# Patient Record
Sex: Female | Born: 1965 | Race: White | Hispanic: No | Marital: Single | Smoking: Former smoker
Health system: Southern US, Community
[De-identification: ages and names within clinical notes are randomized; demographics above are authoritative.]

## PROBLEM LIST (undated history)

## (undated) DIAGNOSIS — M1990 Unspecified osteoarthritis, unspecified site: Secondary | ICD-10-CM

## (undated) DIAGNOSIS — A419 Sepsis, unspecified organism: Secondary | ICD-10-CM

## (undated) DIAGNOSIS — G4730 Sleep apnea, unspecified: Secondary | ICD-10-CM

## (undated) DIAGNOSIS — I2699 Other pulmonary embolism without acute cor pulmonale: Secondary | ICD-10-CM

## (undated) DIAGNOSIS — F419 Anxiety disorder, unspecified: Secondary | ICD-10-CM

## (undated) DIAGNOSIS — Z5189 Encounter for other specified aftercare: Secondary | ICD-10-CM

## (undated) DIAGNOSIS — F32A Depression, unspecified: Secondary | ICD-10-CM

## (undated) HISTORY — PX: GASTRIC BYPASS: SHX52

## (undated) HISTORY — PX: MASTECTOMY: SHX3

---

## 2016-04-28 DIAGNOSIS — C50919 Malignant neoplasm of unspecified site of unspecified female breast: Secondary | ICD-10-CM

## 2016-04-28 HISTORY — DX: Malignant neoplasm of unspecified site of unspecified female breast: C50.919

## 2020-07-17 ENCOUNTER — Emergency Department (HOSPITAL_COMMUNITY): Payer: Medicaid - Out of State

## 2020-07-17 ENCOUNTER — Inpatient Hospital Stay (HOSPITAL_COMMUNITY)
Admission: EM | Admit: 2020-07-17 | Discharge: 2020-07-21 | DRG: 871 | Disposition: A | Discharge status: Home / Self Care | Payer: Medicaid - Out of State | Attending: Internal Medicine | Admitting: Internal Medicine

## 2020-07-17 ENCOUNTER — Other Ambulatory Visit: Payer: Self-pay

## 2020-07-17 ENCOUNTER — Encounter (HOSPITAL_COMMUNITY): Payer: Self-pay

## 2020-07-17 DIAGNOSIS — J189 Pneumonia, unspecified organism: Secondary | ICD-10-CM

## 2020-07-17 DIAGNOSIS — Z9013 Acquired absence of bilateral breasts and nipples: Secondary | ICD-10-CM

## 2020-07-17 DIAGNOSIS — E876 Hypokalemia: Secondary | ICD-10-CM

## 2020-07-17 DIAGNOSIS — I5021 Acute systolic (congestive) heart failure: Secondary | ICD-10-CM | POA: Diagnosis not present

## 2020-07-17 DIAGNOSIS — Z86711 Personal history of pulmonary embolism: Secondary | ICD-10-CM

## 2020-07-17 DIAGNOSIS — A4189 Other specified sepsis: Principal | ICD-10-CM | POA: Diagnosis present

## 2020-07-17 DIAGNOSIS — J9601 Acute respiratory failure with hypoxia: Secondary | ICD-10-CM | POA: Diagnosis present

## 2020-07-17 DIAGNOSIS — A419 Sepsis, unspecified organism: Secondary | ICD-10-CM

## 2020-07-17 DIAGNOSIS — A481 Legionnaires' disease: Secondary | ICD-10-CM | POA: Diagnosis present

## 2020-07-17 DIAGNOSIS — G4730 Sleep apnea, unspecified: Secondary | ICD-10-CM | POA: Diagnosis present

## 2020-07-17 DIAGNOSIS — I248 Other forms of acute ischemic heart disease: Secondary | ICD-10-CM | POA: Diagnosis present

## 2020-07-17 DIAGNOSIS — J188 Other pneumonia, unspecified organism: Secondary | ICD-10-CM | POA: Diagnosis present

## 2020-07-17 DIAGNOSIS — F419 Anxiety disorder, unspecified: Secondary | ICD-10-CM | POA: Diagnosis present

## 2020-07-17 DIAGNOSIS — R7401 Elevation of levels of liver transaminase levels: Secondary | ICD-10-CM | POA: Diagnosis present

## 2020-07-17 DIAGNOSIS — Z20822 Contact with and (suspected) exposure to covid-19: Secondary | ICD-10-CM | POA: Diagnosis present

## 2020-07-17 DIAGNOSIS — Z91048 Other nonmedicinal substance allergy status: Secondary | ICD-10-CM | POA: Diagnosis not present

## 2020-07-17 DIAGNOSIS — R7989 Other specified abnormal findings of blood chemistry: Secondary | ICD-10-CM | POA: Diagnosis present

## 2020-07-17 DIAGNOSIS — E871 Hypo-osmolality and hyponatremia: Secondary | ICD-10-CM | POA: Diagnosis present

## 2020-07-17 DIAGNOSIS — J810 Acute pulmonary edema: Secondary | ICD-10-CM | POA: Diagnosis present

## 2020-07-17 DIAGNOSIS — Z9884 Bariatric surgery status: Secondary | ICD-10-CM

## 2020-07-17 DIAGNOSIS — R509 Fever, unspecified: Secondary | ICD-10-CM

## 2020-07-17 DIAGNOSIS — R9431 Abnormal electrocardiogram [ECG] [EKG]: Secondary | ICD-10-CM

## 2020-07-17 DIAGNOSIS — R778 Other specified abnormalities of plasma proteins: Secondary | ICD-10-CM | POA: Diagnosis not present

## 2020-07-17 HISTORY — DX: Encounter for other specified aftercare: Z51.89

## 2020-07-17 HISTORY — DX: Other pulmonary embolism without acute cor pulmonale: I26.99

## 2020-07-17 HISTORY — DX: Sleep apnea, unspecified: G47.30

## 2020-07-17 HISTORY — DX: Sepsis, unspecified organism: A41.9

## 2020-07-17 HISTORY — DX: Unspecified osteoarthritis, unspecified site: M19.90

## 2020-07-17 HISTORY — DX: Anxiety disorder, unspecified: F41.9

## 2020-07-17 HISTORY — DX: Depression, unspecified: F32.A

## 2020-07-17 LAB — CBC WITH DIFFERENTIAL/PLATELET
Abs Immature Granulocytes: 0 10*3/uL (ref 0.00–0.07)
Basophils Absolute: 0 10*3/uL (ref 0.0–0.1)
Basophils Relative: 0 %
Eosinophils Absolute: 0 10*3/uL (ref 0.0–0.5)
Eosinophils Relative: 0 %
HCT: 36.8 % (ref 36.0–46.0)
Hemoglobin: 13 g/dL (ref 12.0–15.0)
Lymphocytes Relative: 7 %
Lymphs Abs: 0.7 10*3/uL (ref 0.7–4.0)
MCH: 29.1 pg (ref 26.0–34.0)
MCHC: 35.3 g/dL (ref 30.0–36.0)
MCV: 82.5 fL (ref 80.0–100.0)
Monocytes Absolute: 0.9 10*3/uL (ref 0.1–1.0)
Monocytes Relative: 9 %
Neutro Abs: 8.4 10*3/uL — ABNORMAL HIGH (ref 1.7–7.7)
Neutrophils Relative %: 84 %
Platelets: 174 10*3/uL (ref 150–400)
RBC: 4.46 MIL/uL (ref 3.87–5.11)
RDW: 17.5 % — ABNORMAL HIGH (ref 11.5–15.5)
WBC: 10 10*3/uL (ref 4.0–10.5)
nRBC: 0 % (ref 0.0–0.2)
nRBC: 0 /100 WBC

## 2020-07-17 LAB — COMPREHENSIVE METABOLIC PANEL
ALT: 54 U/L — ABNORMAL HIGH (ref 0–44)
AST: 42 U/L — ABNORMAL HIGH (ref 15–41)
Albumin: 2.6 g/dL — ABNORMAL LOW (ref 3.5–5.0)
Alkaline Phosphatase: 56 U/L (ref 38–126)
Anion gap: 11 (ref 5–15)
BUN: 10 mg/dL (ref 6–20)
CO2: 24 mmol/L (ref 22–32)
Calcium: 7.9 mg/dL — ABNORMAL LOW (ref 8.9–10.3)
Chloride: 95 mmol/L — ABNORMAL LOW (ref 98–111)
Creatinine, Ser: 0.82 mg/dL (ref 0.44–1.00)
GFR, Estimated: >60 mL/min (ref >60)
Glucose, Bld: 103 mg/dL — ABNORMAL HIGH (ref 70–99)
Potassium: 2.5 mmol/L — CL (ref 3.5–5.1)
Sodium: 130 mmol/L — ABNORMAL LOW (ref 135–145)
Total Bilirubin: 1.1 mg/dL (ref 0.3–1.2)
Total Protein: 6.1 g/dL — ABNORMAL LOW (ref 6.5–8.1)

## 2020-07-17 LAB — I-STAT BETA HCG BLOOD, ED (MC, WL, AP ONLY): I-stat hCG, quantitative: <5 m[IU]/mL (ref <5)

## 2020-07-17 LAB — URINALYSIS, ROUTINE W REFLEX MICROSCOPIC
Bilirubin Urine: NEGATIVE
Glucose, UA: NEGATIVE mg/dL
Hgb urine dipstick: NEGATIVE
Ketones, ur: 5 mg/dL — AB
Leukocytes,Ua: NEGATIVE
Nitrite: NEGATIVE
Protein, ur: NEGATIVE mg/dL
Specific Gravity, Urine: 1.016 (ref 1.005–1.030)
pH: 7 (ref 5.0–8.0)

## 2020-07-17 LAB — PROTIME-INR
INR: 1.2 (ref 0.8–1.2)
Prothrombin Time: 14.4 seconds (ref 11.4–15.2)

## 2020-07-17 LAB — CBG MONITORING, ED: Glucose-Capillary: 110 mg/dL — ABNORMAL HIGH (ref 70–99)

## 2020-07-17 LAB — LACTIC ACID, PLASMA: Lactic Acid, Venous: 1.5 mmol/L (ref 0.5–1.9)

## 2020-07-17 LAB — BRAIN NATRIURETIC PEPTIDE: B Natriuretic Peptide: 393.9 pg/mL — ABNORMAL HIGH (ref 0.0–100.0)

## 2020-07-17 LAB — TROPONIN I (HIGH SENSITIVITY): Troponin I (High Sensitivity): 35 ng/L — ABNORMAL HIGH (ref <18)

## 2020-07-17 MED ORDER — SODIUM CHLORIDE 0.9 % IV SOLN
2.0000 g | INTRAVENOUS | Status: DC
  Administered 2020-07-17: 2 g via INTRAVENOUS
  Filled 2020-07-17: qty 20

## 2020-07-17 MED ORDER — FUROSEMIDE 10 MG/ML IJ SOLN
40.0000 mg | Freq: Once | INTRAMUSCULAR | Status: AC
  Administered 2020-07-17: 40 mg via INTRAVENOUS
  Filled 2020-07-17: qty 4

## 2020-07-17 MED ORDER — POTASSIUM CHLORIDE CRYS ER 20 MEQ PO TBCR
40.0000 meq | EXTENDED_RELEASE_TABLET | Freq: Once | ORAL | Status: AC
  Administered 2020-07-17: 40 meq via ORAL
  Filled 2020-07-17: qty 2

## 2020-07-17 MED ORDER — POTASSIUM CHLORIDE 10 MEQ/100ML IV SOLN
10.0000 meq | INTRAVENOUS | Status: AC
  Administered 2020-07-17 – 2020-07-18 (×4): 10 meq via INTRAVENOUS
  Filled 2020-07-17 (×4): qty 100

## 2020-07-17 MED ORDER — IBUPROFEN 400 MG PO TABS
600.0000 mg | ORAL_TABLET | Freq: Once | ORAL | Status: DC
  Filled 2020-07-17: qty 1

## 2020-07-17 MED ORDER — ACETAMINOPHEN 500 MG PO TABS
1000.0000 mg | ORAL_TABLET | Freq: Once | ORAL | Status: AC
  Administered 2020-07-17: 1000 mg via ORAL
  Filled 2020-07-17: qty 2

## 2020-07-17 MED ORDER — SODIUM CHLORIDE 0.9 % IV BOLUS
500.0000 mL | Freq: Once | INTRAVENOUS | Status: AC
  Administered 2020-07-17: 500 mL via INTRAVENOUS

## 2020-07-17 MED ORDER — IOHEXOL 350 MG/ML SOLN
80.0000 mL | Freq: Once | INTRAVENOUS | Status: AC | PRN
  Administered 2020-07-17: 80 mL via INTRAVENOUS

## 2020-07-17 MED ORDER — FENTANYL CITRATE (PF) 100 MCG/2ML IJ SOLN
50.0000 ug | Freq: Once | INTRAMUSCULAR | Status: AC
  Administered 2020-07-18: 50 ug via INTRAVENOUS
  Filled 2020-07-17: qty 2

## 2020-07-17 NOTE — ED Triage Notes (Signed)
Pt arrives to ED BIB [facility] due to possible sepsis. Per EMS pt is from out of town and was prescribe PO antibiotics for a UTI but has been off the antibiotics since she has moved here. Is tachycardic, SHOB and weak upon arrival.  T 102.6 Oral BP 158/92 HR 104 R 28 O2 94% 2L [place] CBG 114

## 2020-07-17 NOTE — ED Provider Notes (Signed)
Emergency Department Provider Note   I have reviewed the triage vital signs and the nursing notes.   HISTORY  Chief Complaint Code Sepsis   HPI Sandra Farmer is a 55 y.o. female with past medical history reviewed below but not complete due to patient with no records in this area presents to the emergency department with fever, urine frequency with dysuria, mild back discomfort.  Patient is moving here from [facility].  She drove across the country with a [employer] and got in the last night to be with her daughter.  She states that she stopped in [place] on Tuesday because she was having some dysuria along with frequency and some pain in the back which is more severe than today.  She states she was diagnosed with a urine infection and given IV antibiotics.  Antibiotics were called into the pharmacy but patient has not started them due to being on the road.  She continues to feel badly and has had body aches.  Her back pain is improved but continues to have urine symptoms.  She is also noticed some shortness of breath.  She notes that 8 weeks ago she had a gastric bypass surgery.  She states that went on without complication and she has had a normal recovery and is overall feeling improved.  Denies any abdominal pain, chest pain, or headache. She has been vaccinated and boosted against COVID 19.    Patient arrived by EMS.  They found her to be febrile to 102 F.  She had tachycardia and a found her to be mildly hypoxemic and started her on 2 L nasal cannula.  She was given Zofran for nausea prior to arrival.  No fever medication or antibiotics PTA.   Past Medical History:  Diagnosis Date  . Arthritis   . Blood transfusion without reported diagnosis   . Pulmonary emboli ([place])   . Sepsis ([place])   . Sleep apnea     There are no problems to display for this patient.   Past Surgical History:  Procedure Laterality Date  . GASTRIC BYPASS    . MASTECTOMY Bilateral      Allergies Patient has no known allergies.  History reviewed. No pertinent family history.  Social History Social History   Tobacco Use  . Smoking status: Never Smoker  . Smokeless tobacco: Never Used  Substance Use Topics  . Alcohol use: Never  . Drug use: Never    Review of Systems  Constitutional: Positive fever/chills Eyes: No visual changes. ENT: No sore throat. Cardiovascular: Denies chest pain. Respiratory: Denies shortness of breath. Gastrointestinal: No abdominal pain. Mild nausea, no vomiting.  No diarrhea.  No constipation. Genitourinary: Positive for dysuria and frequency.  Musculoskeletal: Positive for back pain. Skin: Negative for rash. Neurological: Negative for headaches, focal weakness or numbness.  10-point ROS otherwise negative.  ____________________________________________   PHYSICAL EXAM:  VITAL SIGNS: ED Triage Vitals [07/17/20 1855]  Enc Vitals Group     BP 110/66     Pulse Rate (!) 104     Resp (!) 30     Temp 99.1 F (37.3 C)     Temp Source Oral     SpO2 92 %     Weight 200 lb (90.7 kg)     Height 5\' 4"  (1.626 m)   Constitutional: Alert and oriented. Well appearing and in no acute distress. Eyes: Conjunctivae are normal. Head: Atraumatic. Nose: No congestion/rhinnorhea. Mouth/Throat: Mucous membranes are moist.  Neck: No stridor.  Cardiovascular: Mild tachycardia. Good peripheral circulation. Grossly normal heart sounds.   Respiratory: Mild increased respiratory effort.  No retractions. Lungs w/ crackles at the bases.  Gastrointestinal: Soft and nontender. No distention. Well appearing abdominal incisions. No CVA tenderness.  Musculoskeletal: No lower extremity tenderness nor edema. No gross deformities of extremities. Neurologic:  Normal speech and language. No gross focal neurologic deficits are appreciated.  Skin:  Skin is warm, dry and intact. No rash noted.   ____________________________________________   LABS (all  labs ordered are listed, but only abnormal results are displayed)  Labs Reviewed  COMPREHENSIVE METABOLIC PANEL - Abnormal; Notable for the following components:      Result Value   Sodium 130 (*)    Potassium 2.5 (*)    Chloride 95 (*)    Glucose, Bld 103 (*)    Calcium 7.9 (*)    Total Protein 6.1 (*)    Albumin 2.6 (*)    AST 42 (*)    ALT 54 (*)    All other components within normal limits  CBC WITH DIFFERENTIAL/PLATELET - Abnormal; Notable for the following components:   RDW 17.5 (*)    Neutro Abs 8.4 (*)    All other components within normal limits  BRAIN NATRIURETIC PEPTIDE - Abnormal; Notable for the following components:   B Natriuretic Peptide 393.9 (*)    All other components within normal limits  CBG MONITORING, ED - Abnormal; Notable for the following components:   Glucose-Capillary 110 (*)    All other components within normal limits  TROPONIN I (HIGH SENSITIVITY) - Abnormal; Notable for the following components:   Troponin I (High Sensitivity) 35 (*)    All other components within normal limits  CULTURE, BLOOD (ROUTINE X 2)  CULTURE, BLOOD (ROUTINE X 2)  URINE CULTURE  RESP PANEL BY RT-PCR (FLU A&B, COVID) ARPGX2  LACTIC ACID, PLASMA  PROTIME-INR  LACTIC ACID, PLASMA  URINALYSIS, ROUTINE W REFLEX MICROSCOPIC  MAGNESIUM  I-STAT BETA HCG BLOOD, ED ([facility], [facility], [facility] ONLY)  TROPONIN I (HIGH SENSITIVITY)   ____________________________________________  EKG   EKG Interpretation  Date/Time:  Tuesday July 17 2020 18:54:00 EDT Ventricular Rate:  101 PR Interval:    QRS Duration: 101 QT Interval:  396 QTC Calculation: 514 R Axis:   -22 Text Interpretation: Sinus tachycardia Probable left atrial enlargement Borderline left axis deviation Anterior infarct, old Prolonged QT interval Baseline wander in lead(s) V2 V4 V5 V6 No old tracings for comparison Confirmed by Nanda Quinton [phone]) on 07/17/2020 6:56:08 PM        ____________________________________________  RADIOLOGY  DG Chest 2 View  Result Date: 07/17/2020 CLINICAL DATA:  Shortness of breath. EXAM: CHEST - 2 VIEW COMPARISON:  None. FINDINGS: The heart size and mediastinal contours are within normal limits. No pneumothorax or pleural effusion is noted. Mild interstitial densities are noted in both lung bases concerning for possible pulmonary edema. The visualized skeletal structures are unremarkable. IMPRESSION: Mild bibasilar interstitial densities concerning for possible pulmonary edema. Electronically Signed   By: Marijo Conception M.D.   On: 07/17/2020 20:30   CT Angio Chest PE W and/or Wo Contrast  Result Date: 07/17/2020 CLINICAL DATA:  Shortness of breath EXAM: CT ANGIOGRAPHY CHEST WITH CONTRAST TECHNIQUE: Multidetector CT imaging of the chest was performed using the standard protocol during bolus administration of intravenous contrast. Multiplanar CT image reconstructions and MIPs were obtained to evaluate the vascular anatomy. CONTRAST:  57mL OMNIPAQUE IOHEXOL 350 MG/ML SOLN COMPARISON:  None. FINDINGS: Cardiovascular:  There is a optimal opacification of the pulmonary arteries. There is no central,segmental, or subsegmental filling defects within the pulmonary arteries. The heart is normal in size. No pericardial effusion or thickening. No evidence right heart strain. There is normal three-vessel brachiocephalic anatomy without proximal stenosis. The thoracic aorta is normal in appearance. Mediastinum/Nodes: No hilar, mediastinal, or axillary adenopathy. Thyroid gland, trachea, and esophagus demonstrate no significant findings. Lungs/Pleura: Multifocal patchy airspace opacities are seen throughout both lungs predominantly at both lung bases. No pleural effusion or pneumothorax is seen. Upper Abdomen: No acute abnormalities present in the visualized portions of the upper abdomen. There is diffuse low density seen throughout the liver parenchyma  Musculoskeletal: No chest wall abnormality. No acute or significant osseous findings. Review of the MIP images confirms the above findings. IMPRESSION: No central, segmental, or subsegmental pulmonary embolism Multifocal patchy airspace opacities throughout both lungs, likely consistent with multifocal pneumonia Electronically Signed   By: Prudencio Pair M.D.   On: 07/17/2020 22:38    ____________________________________________   PROCEDURES  Procedure(s) performed:   .Critical Care Performed by: Margette Fast, MD Authorized by: Margette Fast, MD   Critical care provider statement:    Critical care time (minutes):  45   Critical care time was exclusive of:  Separately billable procedures and treating other patients and teaching time   Critical care was necessary to treat or prevent imminent or life-threatening deterioration of the following conditions:  Respiratory failure   Critical care was time spent personally by me on the following activities:  Discussions with consultants, evaluation of patient's response to treatment, examination of patient, ordering and performing treatments and interventions, ordering and review of laboratory studies, ordering and review of radiographic studies, pulse oximetry, re-evaluation of patient's condition, obtaining history from patient or surrogate, review of old charts, blood draw for specimens and development of treatment plan with patient or surrogate   I assumed direction of critical care for this patient from another provider in my specialty: no     Care discussed with: admitting provider      ____________________________________________   INITIAL IMPRESSION / [place] / ED COURSE  Pertinent labs & imaging results that were available during my care of the patient were reviewed by me and considered in my medical decision making (see chart for details).   Patient presents the emergency department with febrile illness concerning for  developing sepsis.  She is tachycardic here and is mildly dyspneic with new oxygen requirement.  Lung sounds are equal.  She is not having chest pain after the cross-country drive. PE is a consideration. She was diagnosed with a urinary tract infection in [place] and received a single dose of an unknown IV antibiotic but did not start the oral antibiotics prescribed.  She has no CVA tenderness and her back pain is improving.  Pyelonephritis or other obstructive etiology such as kidney stone is lower on my differential.  Plan for sepsis labs, chest x-ray, reassess.   Labs are consistent with hypokalemia and mild low calcium.  LFTs are slightly elevated along with a very mildly elevated troponin.  BNP is around 400 with no baselines on any of these labs.  Lactate is normal and patient does not have a leukocytosis.  Covid and flu PCR is pending. Patient comfortable on supplemental O2. No signs of impending respiratory compromise. Patient now complaining of HA which has started since arrival in the ED but not before. Ordered abx and will follow with CTA given elevated  PE risk and patient now describing some pain with breathing in. Will not CT the abdomen. No complications since her surgery two months ago and abdomen is diffusely soft and non-tender.   10:44 PM  CTA of the chest shows no central PE.  Findings consistent with likely multifocal pneumonia.  COVID is pending but patient is vaccinated and boosted.  BNP is elevated and I have ordered Lasix along with antibiotics.   Discussed patient's case with [facility] to request admission. Patient and family (if present) updated with plan. Care transferred to [facility] service.  I reviewed all nursing notes, vitals, pertinent old records, EKGs, labs, imaging (as available).  ____________________________________________  FINAL CLINICAL IMPRESSION(S) / ED DIAGNOSES  Final diagnoses:  Febrile illness  Prolonged Q-T interval on ECG  Acute pulmonary edema (HCC)  Multifocal  pneumonia     MEDICATIONS GIVEN DURING THIS VISIT:  Medications  potassium chloride SA (KLOR-CON) CR tablet 40 mEq (has no administration in time range)  potassium chloride 10 mEq in 100 mL IVPB (has no administration in time range)  furosemide (LASIX) injection 40 mg (has no administration in time range)  cefTRIAXone (ROCEPHIN) 2 g in sodium chloride 0.9 % 100 mL IVPB (has no administration in time range)  ibuprofen (ADVIL) tablet 600 mg (has no administration in time range)  sodium chloride 0.9 % bolus 500 mL (500 mLs Intravenous New Bag/Given 07/17/20 1944)  acetaminophen (TYLENOL) tablet 1,000 mg (1,000 mg Oral Given 07/17/20 1941)  iohexol (OMNIPAQUE) 350 MG/ML injection 80 mL (80 mLs Intravenous Contrast Given 07/17/20 2226)    Note:  This document was prepared using Dragon voice recognition software and may include unintentional dictation errors.  Nanda Quinton, MD, [facility] Emergency Medicine    Marrietta Thunder, [facility], MD 07/18/20 [phone]

## 2020-07-18 ENCOUNTER — Encounter (HOSPITAL_COMMUNITY): Payer: Self-pay | Admitting: Internal Medicine

## 2020-07-18 ENCOUNTER — Inpatient Hospital Stay (HOSPITAL_COMMUNITY): Payer: Medicaid - Out of State

## 2020-07-18 ENCOUNTER — Other Ambulatory Visit: Payer: Self-pay

## 2020-07-18 DIAGNOSIS — E876 Hypokalemia: Secondary | ICD-10-CM

## 2020-07-18 DIAGNOSIS — J189 Pneumonia, unspecified organism: Secondary | ICD-10-CM

## 2020-07-18 DIAGNOSIS — I5021 Acute systolic (congestive) heart failure: Secondary | ICD-10-CM

## 2020-07-18 DIAGNOSIS — R7989 Other specified abnormal findings of blood chemistry: Secondary | ICD-10-CM

## 2020-07-18 DIAGNOSIS — R778 Other specified abnormalities of plasma proteins: Secondary | ICD-10-CM

## 2020-07-18 DIAGNOSIS — A419 Sepsis, unspecified organism: Secondary | ICD-10-CM

## 2020-07-18 LAB — RESP PANEL BY RT-PCR (FLU A&B, COVID) ARPGX2
Influenza A by PCR: NEGATIVE
Influenza B by PCR: NEGATIVE
SARS Coronavirus 2 by RT PCR: NEGATIVE

## 2020-07-18 LAB — ECHOCARDIOGRAM COMPLETE
Area-P 1/2: 3.6 cm2
Height: 64 in
S' Lateral: 2.8 cm
Single Plane A4C EF: 63.9 %
Weight: 3200 oz

## 2020-07-18 LAB — BASIC METABOLIC PANEL
Anion gap: 11 (ref 5–15)
BUN: 9 mg/dL (ref 6–20)
CO2: 22 mmol/L (ref 22–32)
Calcium: 7.3 mg/dL — ABNORMAL LOW (ref 8.9–10.3)
Chloride: 96 mmol/L — ABNORMAL LOW (ref 98–111)
Creatinine, Ser: 0.76 mg/dL (ref 0.44–1.00)
GFR, Estimated: >60 mL/min (ref >60)
Glucose, Bld: 87 mg/dL (ref 70–99)
Potassium: 3.9 mmol/L (ref 3.5–5.1)
Sodium: 129 mmol/L — ABNORMAL LOW (ref 135–145)

## 2020-07-18 LAB — CBC
HCT: 36.5 % (ref 36.0–46.0)
Hemoglobin: 12.5 g/dL (ref 12.0–15.0)
MCH: 28.7 pg (ref 26.0–34.0)
MCHC: 34.2 g/dL (ref 30.0–36.0)
MCV: 83.9 fL (ref 80.0–100.0)
Platelets: 203 10*3/uL (ref 150–400)
RBC: 4.35 MIL/uL (ref 3.87–5.11)
RDW: 17.9 % — ABNORMAL HIGH (ref 11.5–15.5)
WBC: 10 10*3/uL (ref 4.0–10.5)
nRBC: 0 % (ref 0.0–0.2)

## 2020-07-18 LAB — PROCALCITONIN: Procalcitonin: 8.69 ng/mL

## 2020-07-18 LAB — LACTIC ACID, PLASMA: Lactic Acid, Venous: 1.6 mmol/L (ref 0.5–1.9)

## 2020-07-18 LAB — TROPONIN I (HIGH SENSITIVITY): Troponin I (High Sensitivity): 24 ng/L — ABNORMAL HIGH (ref <18)

## 2020-07-18 LAB — MRSA PCR SCREENING: MRSA by PCR: NEGATIVE

## 2020-07-18 LAB — MAGNESIUM: Magnesium: 2.1 mg/dL (ref 1.7–2.4)

## 2020-07-18 LAB — HIV ANTIBODY (ROUTINE TESTING W REFLEX): HIV Screen 4th Generation wRfx: NONREACTIVE

## 2020-07-18 MED ORDER — VANCOMYCIN HCL 2000 MG/400ML IV SOLN
2000.0000 mg | Freq: Once | INTRAVENOUS | Status: AC
  Administered 2020-07-18: 2000 mg via INTRAVENOUS
  Filled 2020-07-18: qty 400

## 2020-07-18 MED ORDER — ENOXAPARIN SODIUM 40 MG/0.4ML ~~LOC~~ SOLN
40.0000 mg | SUBCUTANEOUS | Status: DC
  Administered 2020-07-18 – 2020-07-20 (×3): 40 mg via SUBCUTANEOUS
  Filled 2020-07-18 (×4): qty 0.4

## 2020-07-18 MED ORDER — PANTOPRAZOLE SODIUM 40 MG PO TBEC
40.0000 mg | DELAYED_RELEASE_TABLET | Freq: Every day | ORAL | Status: DC
  Administered 2020-07-18 – 2020-07-21 (×4): 40 mg via ORAL
  Filled 2020-07-18 (×4): qty 1

## 2020-07-18 MED ORDER — ACETAMINOPHEN 325 MG PO TABS
650.0000 mg | ORAL_TABLET | Freq: Four times a day (QID) | ORAL | Status: DC | PRN
  Administered 2020-07-18 – 2020-07-20 (×8): 650 mg via ORAL
  Filled 2020-07-18 (×8): qty 2

## 2020-07-18 MED ORDER — SODIUM CHLORIDE 0.9 % IV SOLN
2.0000 g | Freq: Three times a day (TID) | INTRAVENOUS | Status: DC
  Administered 2020-07-18 – 2020-07-21 (×10): 2 g via INTRAVENOUS
  Filled 2020-07-18 (×12): qty 2

## 2020-07-18 MED ORDER — POTASSIUM CHLORIDE CRYS ER 20 MEQ PO TBCR
40.0000 meq | EXTENDED_RELEASE_TABLET | Freq: Once | ORAL | Status: AC
  Administered 2020-07-18: 40 meq via ORAL
  Filled 2020-07-18: qty 2

## 2020-07-18 MED ORDER — IBUPROFEN 400 MG PO TABS
400.0000 mg | ORAL_TABLET | Freq: Once | ORAL | Status: AC
  Administered 2020-07-18: 400 mg via ORAL
  Filled 2020-07-18: qty 1

## 2020-07-18 MED ORDER — VANCOMYCIN HCL 1250 MG/250ML IV SOLN
1250.0000 mg | INTRAVENOUS | Status: DC
  Administered 2020-07-19 – 2020-07-20 (×2): 1250 mg via INTRAVENOUS
  Filled 2020-07-18 (×2): qty 250

## 2020-07-18 MED ORDER — PERFLUTREN LIPID MICROSPHERE
1.0000 mL | INTRAVENOUS | Status: AC | PRN
  Administered 2020-07-18: 2 mL via INTRAVENOUS
  Filled 2020-07-18: qty 10

## 2020-07-18 NOTE — ED Notes (Signed)
Dr. Rathore at bedside 

## 2020-07-18 NOTE — ED Notes (Signed)
Report to 2W RN at this time

## 2020-07-18 NOTE — ED Notes (Signed)
Report at this time from previous shift RN

## 2020-07-18 NOTE — ED Notes (Signed)
Diet ordered for pt

## 2020-07-18 NOTE — ED Notes (Signed)
Patient c/o headache, being unable to sleep. Patient tachypneic in high 30s. Temp 103.2. Dr. Marlowe Sax paged.

## 2020-07-18 NOTE — Progress Notes (Signed)
Pharmacy Antibiotic Note  Sandra Farmer is a 55 y.o. female admitted on 07/17/2020 with pneumonia.  Pharmacy has been consulted for vancomycin and cefepime dosing.  Plan: Vancomycin 2000mg  x1 then 1250mg  IV Q24H. Goal AUC 400-550.  Expected AUC 460.  SCr used 0.83.  Cefepime 2g IV Q8H.  Height: 5\' 4"  (162.6 cm) Weight: 90.7 kg (200 lb) IBW/kg (Calculated) : 54.7  Temp (24hrs), Avg:100.5 F (38.1 C), Min:99.1 F (37.3 C), Max:103.2 F (39.6 C)  Recent Labs  Lab 07/17/20 1858 07/17/20 2309  WBC 10.0  --   CREATININE 0.82  --   LATICACIDVEN 1.5 1.6    Estimated Creatinine Clearance: 83.6 mL/min (by C-G formula based on SCr of 0.82 mg/dL).    Allergies  Allergen Reactions  . Other Rash    Surgical tape     Thank you for allowing pharmacy to be a part of this patient's care.  Wynona Neat, PharmD, BCPS  07/18/2020 5:25 AM

## 2020-07-18 NOTE — ED Notes (Signed)
Spoke with Dr. Marlowe Sax, she will place orders for Tylenol and come evaluate patient.

## 2020-07-18 NOTE — Progress Notes (Signed)
PROGRESS NOTE                                                                             PROGRESS NOTE                                                                                                                                                                                                             Patient Demographics:    Sandra Farmer, is a 55 y.o. female, DOB - Jun 21, 1965, XLK:[id number]  Outpatient Primary MD for the patient is Patient, No Pcp Per    LOS - 1  Admit date - 07/17/2020    Chief Complaint  Patient presents with  . Code Sepsis       Brief Narrative    This is a no charge note as patient seen and admitted earlier today, chart, imaging and labs were reviewed, patient was seen and examined.   HPI: Sandra Farmer is a 55 y.o. female with medical history significant of PE, arthritis, sleep apnea, gastric bypass surgery, mastectomy presented to the ED with complaints of fever and urinary symptoms.  Febrile with EMS with temperature 102.6 F and slightly tachycardic.  Placed on 2 L supplemental oxygen.  Patient reports 1 week history of dyspnea, cough, and high fevers.  States she is moving here from [place].  Last week she went to an emergency room in [place] as she was vomiting and having pain in her back and they told her she had a urinary tract infection.  She was prescribed an antibiotic but has not been able to pick it up from the pharmacy as she is traveling.  States back pain and vomiting have now resolved.  States she had gastric bypass surgery done 8 weeks ago and recovered quite well.  She is fully vaccinated against COVID including booster shot.  No additional history could be obtained from her at this time.  ED Course: Febrile with temperature 103.2 F.  Tachycardic and tachypneic.  Not hypotensive.  Satting well on 2 L supplemental  oxygen.  Labs showing WBC 10.0, hemoglobin 13.0, platelet count 174K.  Sodium  130, potassium 2.5, chloride 95, bicarb 24, BUN 10, creatinine 0.8, glucose 103.  AST 42, ALT 54.  Alk phos and T bili normal.  Lactic acid normal x2.  INR 1.2.  UA without signs of infection.  Urine culture pending.  High-sensitivity troponin 35 >24.  EKG not suggestive of ACS.  BNP 393.  Blood culture x2 pending.  Magnesium 2.1.  SARS-CoV-2 PCR test negative.  Influenza panel negative.    Chest x-ray showing mild bibasilar interstitial densities concerning for possible pulmonary edema.  CT angiogram chest negative for PE.  Showing multifocal patchy airspace opacities throughout both lungs, likely consistent with multifocal pneumonia.  Patient was given Tylenol, fentanyl, IV Lasix 40 mg, oral and IV potassium, ceftriaxone, and a 500 cc fluid bolus.   Subjective:    Sandra Farmer today is weakness, reports cough, fever and chills.    Assessment  & Plan :    Principal Problem:   Multifocal pneumonia Active Problems:   Sepsis ([place])   Hypokalemia   Elevated troponin   Elevated brain natriuretic peptide (BNP) level   Sepsis and acute hypoxemic respiratory failure secondary to multifocal pneumonia: Febrile and tachycardic.  She is desatting to the upper 80s on room air but maintaining sats in the mid 90s on 2 L supplemental oxygen. Significantly tachypneic with respiratory rate in the upper 30s.  No significant leukocytosis on labs.  No lactic acidosis or hypotension.  Tested negative for Covid and flu.  CT angiogram negative for PE but showing findings consistent with multifocal pneumonia. -Vancomycin and cefepime.  Avoiding 30 cc/Kg fluid boluses per sepsis protocol due to elevated BNP.  Blood culture x2 pending.  Check procalcitonin level and monitor WBC count.  Continue supplemental oxygen.    Hypokalemia with EKG changes: Potassium 2.5 and EKG showing mild QT prolongation.  Magnesium 2.1.  Suspect hypokalemia is due to poor oral intake in the setting of recent gastric bypass surgery  and also patient reported some episodes of vomiting last week. -Potassium supplementation and continue to monitor very closely.  Keep potassium above 4 and magnesium above 2.  Avoid QT prolonging drugs if possible.  Repeat EKG in the morning.  Mild troponin elevation: Troponin mildly elevated but stable.  EKG not suggestive of ACS.  Patient is not endorsing chest pain.  Suspect troponin elevation is due to demand ischemia from multifocal pneumonia/sepsis.  Mild transaminitis: Likely due to sepsis.  Alk phos and T bili normal.  Not vomiting at present.  Abdominal exam benign. -Continue to monitor  Elevated BNP: No documented history of CHF or prior echo results in the chart.  Chest x-ray was concerning for possible pulmonary edema and patient received IV Lasix 40 mg in the ED.  However, CT showing findings consistent with multifocal pneumonia. -Echocardiogram ordered, monitor volume status very closely  Recent gastric bypass  SpO2: 96 % O2 Flow Rate (L/min): 2 L/min  Recent Labs  Lab 07/17/20 1858 07/17/20 1914 07/17/20 2309 07/18/20 0021 07/18/20 0515  WBC 10.0  --   --   --  10.0  PLT 174  --   --   --  203  BNP  --  393.9*  --   --   --   AST 42*  --   --   --   --   ALT 54*  --   --   --   --   ALKPHOS 56  --   --   --   --  BILITOT 1.1  --   --   --   --   ALBUMIN 2.6*  --   --   --   --   INR 1.2  --   --   --   --   LATICACIDVEN 1.5  --  1.6  --   --   SARSCOV2NAA  --   --   --  NEGATIVE  --        ABG  No results found for: PHART, PCO2ART, PO2ART, HCO3, TCO2, ACIDBASEDEF, O2SAT    Condition - Extremely Guarded  Family Communication  :  None at bedside  Code Status :  Full  Consults  :  none  Disposition Plan  :    Status is: Inpatient  Remains inpatient appropriate because:IV treatments appropriate due to intensity of illness or inability to take PO   Dispo: The patient is from: Home              Anticipated d/c is to: Home              Patient  currently is not medically stable to d/c.   Difficult to place patient No      DVT Prophylaxis  :  Lovenox Lab Results  Component Value Date   PLT 203 07/18/2020    Diet :  Diet Order            Diet Heart Room service appropriate? Yes; Fluid consistency: Thin  Diet effective now                  Inpatient Medications  Scheduled Meds: . enoxaparin (LOVENOX) injection  40 mg Subcutaneous Q24H   Continuous Infusions: . ceFEPime (MAXIPIME) IV Stopped (07/18/20 1432)  . [START ON 07/19/2020] vancomycin     PRN Meds:.acetaminophen  Antibiotics  :    Anti-infectives (From admission, onward)   Start     Dose/Rate Route Frequency Ordered Stop   07/19/20 0600  vancomycin (VANCOREADY) IVPB 1250 mg/250 mL        1,250 mg 166.7 mL/hr over 90 Minutes Intravenous Every 24 hours 07/18/20 0529     07/18/20 0800  ceFEPIme (MAXIPIME) 2 g in sodium chloride 0.9 % 100 mL IVPB        2 g 200 mL/hr over 30 Minutes Intravenous Every 8 hours 07/18/20 0523     07/18/20 0530  vancomycin (VANCOREADY) IVPB 2000 mg/400 mL        2,000 mg 200 mL/hr over 120 Minutes Intravenous  Once 07/18/20 0523 07/18/20 0813   07/17/20 2200  cefTRIAXone (ROCEPHIN) 2 g in sodium chloride 0.9 % 100 mL IVPB  Status:  Discontinued        2 g 200 mL/hr over 30 Minutes Intravenous Every 24 hours 07/17/20 2155 07/18/20 0517       Sandra Farmer M.D on 07/18/2020 at 3:09 PM  To page go to [email or web address]   [facility] -  Office  [phone]      Objective:   Vitals:   07/18/20 1100 07/18/20 1200 07/18/20 1215 07/18/20 1343  BP: 131/75  (!) 137/91   Pulse: (!) 111 (!) 107 (!) 117   Resp:  (!) 24 (!) 24   Temp:    (!) 100.7 F (38.2 C)  TempSrc:    Oral  SpO2: 94% 94% 96%   Weight:      Height:        Wt Readings from Last 3 Encounters:  07/17/20 90.7 kg  Intake/Output Summary (Last 24 hours) at 07/18/2020 1509 Last data filed at 07/18/2020 1225 Gross per 24 hour  Intake -   Output 3550 ml  Net -3550 ml     Physical Exam  Awake Alert, No new F.N deficits, Normal affect Symmetrical Chest wall movement, Good air movement bilaterally, scattered rales RRR,No Gallops,Rubs or new Murmurs, No Parasternal Heave +ve B.Sounds, Abd Soft, No tenderness, No organomegaly appriciated, No rebound - guarding or rigidity. No Cyanosis, Clubbing or edema, No new Rash or bruise      Data Review:    CBC Recent Labs  Lab 07/17/20 1858 07/18/20 0515  WBC 10.0 10.0  HGB 13.0 12.5  HCT 36.8 36.5  PLT 174 203  MCV 82.5 83.9  MCH 29.1 28.7  MCHC 35.3 34.2  RDW 17.5* 17.9*  LYMPHSABS 0.7  --   MONOABS 0.9  --   EOSABS 0.0  --   BASOSABS 0.0  --     Recent Labs  Lab 07/17/20 1858 07/17/20 1914 07/17/20 2309 07/17/20 2310 07/18/20 0515  NA 130*  --   --   --  129*  K 2.5*  --   --   --  3.9  CL 95*  --   --   --  96*  CO2 24  --   --   --  22  GLUCOSE 103*  --   --   --  87  BUN 10  --   --   --  9  CREATININE 0.82  --   --   --  0.76  CALCIUM 7.9*  --   --   --  7.3*  AST 42*  --   --   --   --   ALT 54*  --   --   --   --   ALKPHOS 56  --   --   --   --   BILITOT 1.1  --   --   --   --   ALBUMIN 2.6*  --   --   --   --   MG  --   --   --  2.1  --   LATICACIDVEN 1.5  --  1.6  --   --   INR 1.2  --   --   --   --   BNP  --  393.9*  --   --   --     ------------------------------------------------------------------------------------------------------------------ No results for input(s): CHOL, HDL, LDLCALC, TRIG, CHOLHDL, LDLDIRECT in the last 72 hours.  No results found for: HGBA1C ------------------------------------------------------------------------------------------------------------------ No results for input(s): TSH, T4TOTAL, T3FREE, THYROIDAB in the last 72 hours.  Invalid input(s): FREET3  Cardiac Enzymes No results for input(s): CKMB, TROPONINI, MYOGLOBIN in the last 168 hours.  Invalid input(s):  CK ------------------------------------------------------------------------------------------------------------------    Component Value Date/Time   BNP 393.9 (H) 07/17/2020 1914    Micro Results Recent Results (from the past 240 hour(s))  Culture, blood (Routine x 2)     Status: None (Preliminary result)   Collection Time: 07/17/20  7:30 PM   Specimen: BLOOD  Result Value Ref Range Status   Specimen Description BLOOD RIGHT ANTECUBITAL  Final   Special Requests   Final    BOTTLES DRAWN AEROBIC AND ANAEROBIC Blood Culture results may not be optimal due to an inadequate volume of blood received in culture bottles   Culture   Final    NO GROWTH < 12 HOURS Performed at [facility],  [address], [place], [place] [address]    Report Status PENDING  Incomplete  Culture, blood (Routine x 2)     Status: None (Preliminary result)   Collection Time: 07/17/20  7:35 PM   Specimen: BLOOD LEFT FOREARM  Result Value Ref Range Status   Specimen Description BLOOD LEFT FOREARM  Final   Special Requests   Final    BOTTLES DRAWN AEROBIC AND ANAEROBIC Blood Culture results may not be optimal due to an inadequate volume of blood received in culture bottles   Culture   Final    NO GROWTH < 12 HOURS Performed at [facility], [place] [address], [place], [place] [address]    Report Status PENDING  Incomplete  Resp Panel by RT-PCR (Flu A&B, Covid) Nasopharyngeal Swab     Status: None   Collection Time: 07/18/20 12:21 AM   Specimen: Nasopharyngeal Swab; Nasopharyngeal(NP) swabs in vial transport medium  Result Value Ref Range Status   SARS Coronavirus 2 by RT PCR NEGATIVE NEGATIVE Final    Comment: (NOTE) SARS-CoV-2 target nucleic acids are NOT DETECTED.  The SARS-CoV-2 RNA is generally detectable in upper respiratory specimens during the acute phase of infection. The lowest concentration of SARS-CoV-2 viral copies this assay can detect is 138 copies/mL. A negative result does not  preclude SARS-Cov-2 infection and should not be used as the sole basis for treatment or other patient management decisions. A negative result may occur with  improper specimen collection/handling, submission of specimen other than nasopharyngeal swab, presence of viral mutation(s) within the areas targeted by this assay, and inadequate number of viral copies(<138 copies/mL). A negative result must be combined with clinical observations, patient history, and epidemiological information. The expected result is Negative.  Fact Sheet for Patients:  EntrepreneurPulse.com.au  Fact Sheet for Healthcare Providers:  IncredibleEmployment.be  This test is no t yet approved or cleared by the [place] FDA and  has been authorized for detection and/or diagnosis of SARS-CoV-2 by FDA under an Emergency Use Authorization (EUA). This EUA will remain  in effect (meaning this test can be used) for the duration of the COVID-19 declaration under Section 564(b)(1) of the Act, 21 U.S.C.section 360bbb-3(b)(1), unless the authorization is terminated  or revoked sooner.       Influenza A by PCR NEGATIVE NEGATIVE Final   Influenza B by PCR NEGATIVE NEGATIVE Final    Comment: (NOTE) The Xpert Xpress SARS-CoV-2/FLU/RSV plus assay is intended as an aid in the diagnosis of influenza from Nasopharyngeal swab specimens and should not be used as a sole basis for treatment. Nasal washings and aspirates are unacceptable for Xpert Xpress SARS-CoV-2/FLU/RSV testing.  Fact Sheet for Patients: EntrepreneurPulse.com.au  Fact Sheet for Healthcare Providers: IncredibleEmployment.be  This test is not yet approved or cleared by the [place] FDA and has been authorized for detection and/or diagnosis of SARS-CoV-2 by FDA under an Emergency Use Authorization (EUA). This EUA will remain in effect (meaning this test can be used) for the  duration of the COVID-19 declaration under Section 564(b)(1) of the Act, 21 U.S.C. section 360bbb-3(b)(1), unless the authorization is terminated or revoked.  Performed at [facility], [place] [address], [place], [place] [address]   MRSA PCR Screening     Status: None   Collection Time: 07/18/20  8:47 AM   Specimen: Nasopharyngeal  Result Value Ref Range Status   MRSA by PCR NEGATIVE NEGATIVE Final    Comment:        The  GeneXpert MRSA Assay (FDA approved for NASAL specimens only), is one component of a comprehensive MRSA colonization surveillance program. It is not intended to diagnose MRSA infection nor to guide or monitor treatment for MRSA infections. Performed at [facility], [place] [address] [place], [place] [address]     Radiology Reports DG Chest 2 View  Result Date: 07/17/2020 CLINICAL DATA:  Shortness of breath. EXAM: CHEST - 2 VIEW COMPARISON:  None. FINDINGS: The heart size and mediastinal contours are within normal limits. No pneumothorax or pleural effusion is noted. Mild interstitial densities are noted in both lung bases concerning for possible pulmonary edema. The visualized skeletal structures are unremarkable. IMPRESSION: Mild bibasilar interstitial densities concerning for possible pulmonary edema. Electronically Signed   By: Marijo Conception M.D.   On: 07/17/2020 20:30   CT Angio Chest PE W and/or Wo Contrast  Result Date: 07/17/2020 CLINICAL DATA:  Shortness of breath EXAM: CT ANGIOGRAPHY CHEST WITH CONTRAST TECHNIQUE: Multidetector CT imaging of the chest was performed using the standard protocol during bolus administration of intravenous contrast. Multiplanar CT image reconstructions and MIPs were obtained to evaluate the vascular anatomy. CONTRAST:  18m OMNIPAQUE IOHEXOL 350 MG/ML SOLN COMPARISON:  None. FINDINGS: Cardiovascular: There is a optimal opacification of the pulmonary arteries. There is no central,segmental, or subsegmental filling  defects within the pulmonary arteries. The heart is normal in size. No pericardial effusion or thickening. No evidence right heart strain. There is normal three-vessel brachiocephalic anatomy without proximal stenosis. The thoracic aorta is normal in appearance. Mediastinum/Nodes: No hilar, mediastinal, or axillary adenopathy. Thyroid gland, trachea, and esophagus demonstrate no significant findings. Lungs/Pleura: Multifocal patchy airspace opacities are seen throughout both lungs predominantly at both lung bases. No pleural effusion or pneumothorax is seen. Upper Abdomen: No acute abnormalities present in the visualized portions of the upper abdomen. There is diffuse low density seen throughout the liver parenchyma Musculoskeletal: No chest wall abnormality. No acute or significant osseous findings. Review of the MIP images confirms the above findings. IMPRESSION: No central, segmental, or subsegmental pulmonary embolism Multifocal patchy airspace opacities throughout both lungs, likely consistent with multifocal pneumonia Electronically Signed   By: BPrudencio PairM.D.   On: 07/17/2020 22:38

## 2020-07-18 NOTE — ED Notes (Signed)
Attempted report at this time.

## 2020-07-18 NOTE — H&P (Signed)
History and Physical    [name]:[id number] DOB: [birth year] DOA: 07/17/2020  PCP: Patient, No Pcp Per Patient coming from: Home  Chief Complaint: Fever  HPI: Sandra Farmer is a 55 y.o. female with medical history significant of PE, arthritis, sleep apnea, gastric bypass surgery, mastectomy presented to the ED with complaints of fever and urinary symptoms.  Febrile with EMS with temperature 102.6 F and slightly tachycardic.  Placed on 2 L supplemental oxygen.  Patient reports 1 week history of dyspnea, cough, and high fevers.  States she is moving here from [place].  Last week she went to an emergency room in [place] as she was vomiting and having pain in her back and they told her she had a urinary tract infection.  She was prescribed an antibiotic but has not been able to pick it up from the pharmacy as she is traveling.  States back pain and vomiting have now resolved.  States she had gastric bypass surgery done 8 weeks ago and recovered quite well.  She is fully vaccinated against COVID including booster shot.  No additional history could be obtained from her at this time.  ED Course: Febrile with temperature 103.2 F.  Tachycardic and tachypneic.  Not hypotensive.  Satting well on 2 L supplemental oxygen.  Labs showing WBC 10.0, hemoglobin 13.0, platelet count 174K.  Sodium 130, potassium 2.5, chloride 95, bicarb 24, BUN 10, creatinine 0.8, glucose 103.  AST 42, ALT 54.  Alk phos and T bili normal.  Lactic acid normal x2.  INR 1.2.  UA without signs of infection.  Urine culture pending.  High-sensitivity troponin 35 >24.  EKG not suggestive of ACS.  BNP 393.  Blood culture x2 pending.  Magnesium 2.1.  SARS-CoV-2 PCR test negative.  Influenza panel negative.    Chest x-ray showing mild bibasilar interstitial densities concerning for possible pulmonary edema.  CT angiogram chest negative for PE.  Showing multifocal patchy airspace opacities throughout both lungs, likely  consistent with multifocal pneumonia.  Patient was given Tylenol, fentanyl, IV Lasix 40 mg, oral and IV potassium, ceftriaxone, and a 500 cc fluid bolus.  Review of Systems:  All systems reviewed and apart from history of presenting illness, are negative.  Past Medical History:  Diagnosis Date  . Arthritis   . Blood transfusion without reported diagnosis   . Pulmonary emboli ([place])   . Sepsis ([place])   . Sleep apnea     Past Surgical History:  Procedure Laterality Date  . GASTRIC BYPASS    . MASTECTOMY Bilateral      reports that she has never smoked. She has never used smokeless tobacco. She reports that she does not drink alcohol and does not use drugs.  Allergies  Allergen Reactions  . Other Rash    Surgical tape    History reviewed. No pertinent family history.  Prior to Admission medications   Medication Sig Start Date End Date Taking? Authorizing Provider  Cyanocobalamin (VITAMIN B 12 PO) Take 1 tablet by mouth daily.   Yes [provider]  Multiple Vitamins-Minerals (ONE-A-DAY WOMENS 50+) TABS Take 1 tablet by mouth daily.   Yes [provider]    Physical Exam: Vitals:   07/18/20 0730 07/18/20 0745 07/18/20 0800 07/18/20 0822  BP: 126/86 115/87 113/73   Pulse: (!) 108 (!) 103 (!) 101   Resp: (!) 35 (!) 23 (!) 34   Temp:    99 F (37.2 C)  TempSrc:    Oral  SpO2: 95% 95% 97%   Weight:      Height:        Physical Exam Constitutional:      Appearance: She is ill-appearing.  HENT:     Head: Normocephalic and atraumatic.  Eyes:     Extraocular Movements: Extraocular movements intact.     Conjunctiva/sclera: Conjunctivae normal.  Cardiovascular:     Rate and Rhythm: Regular rhythm. Tachycardia present.     Pulses: Normal pulses.  Pulmonary:     Breath sounds: No wheezing.     Comments: Tachypneic with respiratory rate in the high 30s Coarse breath sounds bilaterally Satting in the mid 90s on 2 L supplemental oxygen Abdominal:      General: Bowel sounds are normal. There is no distension.     Palpations: Abdomen is soft.     Tenderness: There is no abdominal tenderness.  Musculoskeletal:        General: No swelling or tenderness.     Cervical back: Normal range of motion and neck supple.  Skin:    General: Skin is warm and dry.  Neurological:     Mental Status: She is alert and oriented to person, place, and time.     Labs on Admission: I have personally reviewed following labs and imaging studies  CBC: Recent Labs  Lab 07/17/20 1858 07/18/20 0515  WBC 10.0 10.0  NEUTROABS 8.4*  --   HGB 13.0 12.5  HCT 36.8 36.5  MCV 82.5 83.9  PLT 174 628   Basic Metabolic Panel: Recent Labs  Lab 07/17/20 1858 07/17/20 2310 07/18/20 0515  NA 130*  --  129*  K 2.5*  --  3.9  CL 95*  --  96*  CO2 24  --  22  GLUCOSE 103*  --  87  BUN 10  --  9  CREATININE 0.82  --  0.76  CALCIUM 7.9*  --  7.3*  MG  --  2.1  --    GFR: Estimated Creatinine Clearance: 85.7 mL/min (by C-G formula based on SCr of 0.76 mg/dL). Liver Function Tests: Recent Labs  Lab 07/17/20 1858  AST 42*  ALT 54*  ALKPHOS 56  BILITOT 1.1  PROT 6.1*  ALBUMIN 2.6*   No results for input(s): LIPASE, AMYLASE in the last 168 hours. No results for input(s): AMMONIA in the last 168 hours. Coagulation Profile: Recent Labs  Lab 07/17/20 1858  INR 1.2   Cardiac Enzymes: No results for input(s): CKTOTAL, CKMB, CKMBINDEX, TROPONINI in the last 168 hours. BNP (last 3 results) No results for input(s): PROBNP in the last 8760 hours. HbA1C: No results for input(s): HGBA1C in the last 72 hours. CBG: Recent Labs  Lab 07/17/20 1901  GLUCAP 110*   Lipid Profile: No results for input(s): CHOL, HDL, LDLCALC, TRIG, CHOLHDL, LDLDIRECT in the last 72 hours. Thyroid Function Tests: No results for input(s): TSH, T4TOTAL, FREET4, T3FREE, THYROIDAB in the last 72 hours. Anemia Panel: No results for input(s): VITAMINB12, FOLATE, FERRITIN, TIBC,  IRON, RETICCTPCT in the last 72 hours. Urine analysis:    Component Value Date/Time   COLORURINE YELLOW 07/17/2020 1858   APPEARANCEUR CLEAR 07/17/2020 1858   LABSPEC 1.016 07/17/2020 1858   PHURINE 7.0 07/17/2020 1858   GLUCOSEU NEGATIVE 07/17/2020 1858   HGBUR NEGATIVE 07/17/2020 1858   BILIRUBINUR NEGATIVE 07/17/2020 1858   KETONESUR 5 (A) 07/17/2020 1858   PROTEINUR NEGATIVE 07/17/2020 1858   NITRITE NEGATIVE 07/17/2020 1858   LEUKOCYTESUR NEGATIVE 07/17/2020 1858  Radiological Exams on Admission: DG Chest 2 View  Result Date: 07/17/2020 CLINICAL DATA:  Shortness of breath. EXAM: CHEST - 2 VIEW COMPARISON:  None. FINDINGS: The heart size and mediastinal contours are within normal limits. No pneumothorax or pleural effusion is noted. Mild interstitial densities are noted in both lung bases concerning for possible pulmonary edema. The visualized skeletal structures are unremarkable. IMPRESSION: Mild bibasilar interstitial densities concerning for possible pulmonary edema. Electronically Signed   By: Marijo Conception M.D.   On: 07/17/2020 20:30   CT Angio Chest PE W and/or Wo Contrast  Result Date: 07/17/2020 CLINICAL DATA:  Shortness of breath EXAM: CT ANGIOGRAPHY CHEST WITH CONTRAST TECHNIQUE: Multidetector CT imaging of the chest was performed using the standard protocol during bolus administration of intravenous contrast. Multiplanar CT image reconstructions and MIPs were obtained to evaluate the vascular anatomy. CONTRAST:  69m OMNIPAQUE IOHEXOL 350 MG/ML SOLN COMPARISON:  None. FINDINGS: Cardiovascular: There is a optimal opacification of the pulmonary arteries. There is no central,segmental, or subsegmental filling defects within the pulmonary arteries. The heart is normal in size. No pericardial effusion or thickening. No evidence right heart strain. There is normal three-vessel brachiocephalic anatomy without proximal stenosis. The thoracic aorta is normal in appearance.  Mediastinum/Nodes: No hilar, mediastinal, or axillary adenopathy. Thyroid gland, trachea, and esophagus demonstrate no significant findings. Lungs/Pleura: Multifocal patchy airspace opacities are seen throughout both lungs predominantly at both lung bases. No pleural effusion or pneumothorax is seen. Upper Abdomen: No acute abnormalities present in the visualized portions of the upper abdomen. There is diffuse low density seen throughout the liver parenchyma Musculoskeletal: No chest wall abnormality. No acute or significant osseous findings. Review of the MIP images confirms the above findings. IMPRESSION: No central, segmental, or subsegmental pulmonary embolism Multifocal patchy airspace opacities throughout both lungs, likely consistent with multifocal pneumonia Electronically Signed   By: BPrudencio PairM.D.   On: 07/17/2020 22:38    EKG: Independently reviewed.  Sinus tachycardia, QTC 514.  No prior tracing for comparison.  Assessment/Plan Principal Problem:   Multifocal pneumonia Active Problems:   Sepsis ([place]   Hypokalemia   Elevated troponin   Elevated brain natriuretic peptide (BNP) level   Sepsis and acute hypoxemic respiratory failure secondary to multifocal pneumonia: Febrile and tachycardic.  She is desatting to the upper 80s on room air but maintaining sats in the mid 90s on 2 L supplemental oxygen. Significantly tachypneic with respiratory rate in the upper 30s.  No significant leukocytosis on labs.  No lactic acidosis or hypotension.  Tested negative for Covid and flu.  CT angiogram negative for PE but showing findings consistent with multifocal pneumonia. -Vancomycin and cefepime.  Avoiding 30 cc/Kg fluid boluses per sepsis protocol due to elevated BNP.  Blood culture x2 pending.  Check procalcitonin level and monitor WBC count.  Continue supplemental oxygen.  Given significant tachypnea, I discussed the case with Dr. AElsworth Sohofrom critical care who did not feel that the patient needed  ICU admission at this time and recommended continuing current management but reconsulting critical care service if the patient's respiratory status continued to decline.  Hypokalemia with EKG changes: Potassium 2.5 and EKG showing mild QT prolongation.  Magnesium 2.1.  Suspect hypokalemia is due to poor oral intake in the setting of recent gastric bypass surgery and also patient reported some episodes of vomiting last week. -Potassium supplementation and continue to monitor very closely.  Keep potassium above 4 and magnesium above 2.  Avoid QT prolonging drugs if  possible.  Repeat EKG in the morning.  Mild troponin elevation: Troponin mildly elevated but stable.  EKG not suggestive of ACS.  Patient is not endorsing chest pain.  Suspect troponin elevation is due to demand ischemia from multifocal pneumonia/sepsis.  Mild transaminitis: Likely due to sepsis.  Alk phos and T bili normal.  Not vomiting at present.  Abdominal exam benign. -Continue to monitor  Elevated BNP: No documented history of CHF or prior echo results in the chart.  Chest x-ray was concerning for possible pulmonary edema and patient received IV Lasix 40 mg in the ED.  However, CT showing findings consistent with multifocal pneumonia. -Echocardiogram ordered, monitor volume status very closely  DVT prophylaxis: Lovenox Code Status: Full code Family Communication: No family available at this time. Disposition Plan: Status is: Inpatient  Remains inpatient appropriate because:Inpatient level of care appropriate due to severity of illness   Dispo: The patient is from: Home              Anticipated d/c is to: Home              Patient currently is not medically stable to d/c.   Difficult to place patient No   Level of care: Level of care: Progressive   The medical decision making on this patient was of high complexity and the patient is at high risk for clinical deterioration, therefore this is a level 3 visit.  Shela Leff MD [facility]  If 7PM-7AM, please contact night-coverage [email or web address]  07/18/2020, 8:24 AM

## 2020-07-18 NOTE — Progress Notes (Signed)
  Echocardiogram 2D Echocardiogram has been performed.  [name] 07/18/2020, 11:50 AM

## 2020-07-19 DIAGNOSIS — A419 Sepsis, unspecified organism: Secondary | ICD-10-CM

## 2020-07-19 DIAGNOSIS — E876 Hypokalemia: Secondary | ICD-10-CM

## 2020-07-19 DIAGNOSIS — R778 Other specified abnormalities of plasma proteins: Secondary | ICD-10-CM

## 2020-07-19 LAB — CBC
HCT: 37.4 % (ref 36.0–46.0)
Hemoglobin: 12.6 g/dL (ref 12.0–15.0)
MCH: 28.3 pg (ref 26.0–34.0)
MCHC: 33.7 g/dL (ref 30.0–36.0)
MCV: 84 fL (ref 80.0–100.0)
Platelets: 258 10*3/uL (ref 150–400)
RBC: 4.45 MIL/uL (ref 3.87–5.11)
RDW: 18.5 % — ABNORMAL HIGH (ref 11.5–15.5)
WBC: 9.3 10*3/uL (ref 4.0–10.5)
nRBC: 0 % (ref 0.0–0.2)

## 2020-07-19 LAB — BASIC METABOLIC PANEL
Anion gap: 11 (ref 5–15)
BUN: 10 mg/dL (ref 6–20)
CO2: 23 mmol/L (ref 22–32)
Calcium: 8 mg/dL — ABNORMAL LOW (ref 8.9–10.3)
Chloride: 98 mmol/L (ref 98–111)
Creatinine, Ser: 0.65 mg/dL (ref 0.44–1.00)
GFR, Estimated: >60 mL/min (ref >60)
Glucose, Bld: 80 mg/dL (ref 70–99)
Potassium: 4 mmol/L (ref 3.5–5.1)
Sodium: 132 mmol/L — ABNORMAL LOW (ref 135–145)

## 2020-07-19 LAB — URINE CULTURE

## 2020-07-19 LAB — PROCALCITONIN: Procalcitonin: 6.8 ng/mL

## 2020-07-19 MED ORDER — MELATONIN 3 MG PO TABS
3.0000 mg | ORAL_TABLET | Freq: Every day | ORAL | Status: DC
  Administered 2020-07-19 – 2020-07-20 (×2): 3 mg via ORAL
  Filled 2020-07-19 (×2): qty 1

## 2020-07-19 MED ORDER — ALPRAZOLAM 0.25 MG PO TABS
0.2500 mg | ORAL_TABLET | Freq: Two times a day (BID) | ORAL | Status: DC | PRN
  Administered 2020-07-19 – 2020-07-20 (×3): 0.25 mg via ORAL
  Filled 2020-07-19 (×3): qty 1

## 2020-07-19 NOTE — Progress Notes (Signed)
PROGRESS NOTE                                                                             PROGRESS NOTE                                                                                                                                                                                                             Patient Demographics:    Sandra Farmer, is a 55 y.o. female, DOB - [birth year], IFO:[id number]  Outpatient Primary MD for the patient is Patient, No Pcp Per    LOS - 2  Admit date - 07/17/2020    Chief Complaint  Patient presents with  . Code Sepsis       Brief Narrative    Sandra Farmer is a 55 y.o. female with medical history significant of PE, arthritis, sleep apnea, gastric bypass surgery, mastectomy presented to the ED with complaints of fever and urinary symptoms.  Febrile with EMS with temperature 102.6 F and slightly tachycardic.  Placed on 2 L supplemental oxygen.  Patient reports 1 week history of dyspnea, cough, and high fevers.  States she is moving here from [place].  Last week she went to an emergency room in [place] as she was vomiting and having pain in her back and they told her she had a urinary tract infection.  She was prescribed an antibiotic but has not been able to pick it up from the pharmacy as she is traveling.  States back pain and vomiting have now resolved.  States she had gastric bypass surgery done 8 weeks ago and recovered quite well.  She is fully vaccinated against COVID including booster shot.  No additional history could be obtained from her at this time.  ED Course: Febrile with temperature 103.2 F.  Tachycardic and tachypneic.  Not hypotensive.  Satting well on 2 L supplemental oxygen.  Labs showing WBC 10.0, hemoglobin 13.0, platelet count 174K.  Sodium 130, potassium 2.5, chloride 95, bicarb 24, BUN 10, creatinine 0.8, glucose 103.  AST 42, ALT 54.  Alk phos and T bili normal.  Lactic acid normal x2.   INR 1.2.  UA without signs of infection.  Urine culture pending.  High-sensitivity troponin 35 >24.  EKG not suggestive of ACS.  BNP 393.  Blood culture x2 pending.  Magnesium 2.1.  SARS-CoV-2 PCR test negative.  Influenza panel negative.    Chest x-ray showing mild bibasilar interstitial densities concerning for possible pulmonary edema.  CT angiogram chest negative for PE.  Showing multifocal patchy airspace opacities throughout both lungs, likely consistent with multifocal pneumonia.  Patient was given Tylenol, fentanyl, IV Lasix 40 mg, oral and IV potassium, ceftriaxone, and a 500 cc fluid bolus.   Subjective:    Sandra Farmer today remains afebrile overnight, elevated morning, she still reports cough, shortness of breath and Xarelto, as well as a good report some anxiety .   Assessment  & Plan :    Principal Problem:   Multifocal pneumonia Active Problems:   Sepsis ([place])   Hypokalemia   Elevated troponin   Elevated brain natriuretic peptide (BNP) level   Sepsis and acute hypoxemic respiratory failure secondary to multifocal pneumonia: -Sepsis present on admission. -She remains on 3 L nasal cannula this morning. -Work-up significant for multifocal pulmonary,. -Continue with IV antibiotics, narrow as tolerated, she remains febrile, will continue with current regimen. -Procalcitonin trending down. -She was encouraged with incentive spirometry, flutter valve, out of bed to chair - CT angiogram negative for PE but showing findings consistent with multifocal pneumonia.   Hypokalemia : - repelted  Mild troponin elevation:  - non ACS pattern.  Trending down - demand ischemia Due to sepsis and hypoxia.  .  Mild transaminitis: -  Likely due to sepsis.  Alk phos and T bili normal.  Not vomiting at present.  Abdominal exam benign. -Continue to monitor  Recent gastric bypass -Abdominal exam is benign.  SpO2: 91 % O2 Flow Rate (L/min): 4 L/min  Recent Labs  Lab  07/17/20 1858 07/17/20 1914 07/17/20 2309 07/18/20 0021 07/18/20 0515 07/18/20 1748 07/19/20 0327  WBC 10.0  --   --   --  10.0  --  9.3  PLT 174  --   --   --  203  --  258  BNP  --  393.9*  --   --   --   --   --   PROCALCITON  --   --   --   --   --  8.69 6.80  AST 42*  --   --   --   --   --   --   ALT 54*  --   --   --   --   --   --   ALKPHOS 56  --   --   --   --   --   --   BILITOT 1.1  --   --   --   --   --   --   ALBUMIN 2.6*  --   --   --   --   --   --   INR 1.2  --   --   --   --   --   --   LATICACIDVEN 1.5  --  1.6  --   --   --   --   SARSCOV2NAA  --   --   --  NEGATIVE  --   --   --  ABG  No results found for: PHART, PCO2ART, PO2ART, HCO3, TCO2, ACIDBASEDEF, O2SAT    Condition - Extremely Guarded  Family Communication  :  None at bedside  Code Status :  Full  Consults  :  none  Disposition Plan  :    Status is: Inpatient  Remains inpatient appropriate because:IV treatments appropriate due to intensity of illness or inability to take PO   Dispo: The patient is from: Home              Anticipated d/c is to: Home              Patient currently is not medically stable to d/c.   Difficult to place patient No      DVT Prophylaxis  :  Lovenox Lab Results  Component Value Date   PLT 258 07/19/2020    Diet :  Diet Order            Diet Heart Room service appropriate? Yes; Fluid consistency: Thin  Diet effective now                  Inpatient Medications  Scheduled Meds: . enoxaparin (LOVENOX) injection  40 mg Subcutaneous Q24H  . pantoprazole  40 mg Oral Daily   Continuous Infusions: . ceFEPime (MAXIPIME) IV 2 g (07/19/20 0858)  . vancomycin 1,250 mg (07/19/20 0655)   PRN Meds:.acetaminophen, ALPRAZolam  Antibiotics  :    Anti-infectives (From admission, onward)   Start     Dose/Rate Route Frequency Ordered Stop   07/19/20 0600  vancomycin (VANCOREADY) IVPB 1250 mg/250 mL        1,250 mg 166.7 mL/hr over 90 Minutes  Intravenous Every 24 hours 07/18/20 0529     07/18/20 0800  ceFEPIme (MAXIPIME) 2 g in sodium chloride 0.9 % 100 mL IVPB        2 g 200 mL/hr over 30 Minutes Intravenous Every 8 hours 07/18/20 0523     07/18/20 0530  vancomycin (VANCOREADY) IVPB 2000 mg/400 mL        2,000 mg 200 mL/hr over 120 Minutes Intravenous  Once 07/18/20 0523 07/18/20 0813   07/17/20 2200  cefTRIAXone (ROCEPHIN) 2 g in sodium chloride 0.9 % 100 mL IVPB  Status:  Discontinued        2 g 200 mL/hr over 30 Minutes Intravenous Every 24 hours 07/17/20 2155 07/18/20 0517       Sandra Farmer M.D on 07/19/2020 at 10:52 AM  To page go to [email or web address]   [facility] -  Office  [phone]      Objective:   Vitals:   07/19/20 0751 07/19/20 0912 07/19/20 0923 07/19/20 1029  BP:      Pulse: (!) 105   98  Resp: (!) 25   (!) 28  Temp:  (!) 101.6 F (38.7 C) 99.3 F (37.4 C) 98.9 F (37.2 C)  TempSrc:  Oral Oral   SpO2: 96%   91%  Weight:      Height:        Wt Readings from Last 3 Encounters:  07/17/20 90.7 kg     Intake/Output Summary (Last 24 hours) at 07/19/2020 1052 Last data filed at 07/18/2020 1625 Gross per 24 hour  Intake 200.45 ml  Output 650 ml  Net -449.55 ml     Physical Exam  Awake Alert, Oriented X 3, No new F.N deficits, Normal affect Symmetrical Chest wall movement, Good air movement bilaterally, scattered rales RRR,No Gallops,Rubs or new Murmurs,  No Parasternal Heave +ve B.Sounds, Abd Soft, No tenderness, No rebound - guarding or rigidity. No Cyanosis, Clubbing or edema, No new Rash or bruise       Data Review:    CBC Recent Labs  Lab 07/17/20 1858 07/18/20 0515 07/19/20 0327  WBC 10.0 10.0 9.3  HGB 13.0 12.5 12.6  HCT 36.8 36.5 37.4  PLT 174 203 258  MCV 82.5 83.9 84.0  MCH 29.1 28.7 28.3  MCHC 35.3 34.2 33.7  RDW 17.5* 17.9* 18.5*  LYMPHSABS 0.7  --   --   MONOABS 0.9  --   --   EOSABS 0.0  --   --   BASOSABS 0.0  --   --     Recent Labs  Lab  07/17/20 1858 07/17/20 1914 07/17/20 2309 07/17/20 2310 07/18/20 0515 07/18/20 1748 07/19/20 0327  NA 130*  --   --   --  129*  --  132*  K 2.5*  --   --   --  3.9  --  4.0  CL 95*  --   --   --  96*  --  98  CO2 24  --   --   --  22  --  23  GLUCOSE 103*  --   --   --  87  --  80  BUN 10  --   --   --  9  --  10  CREATININE 0.82  --   --   --  0.76  --  0.65  CALCIUM 7.9*  --   --   --  7.3*  --  8.0*  AST 42*  --   --   --   --   --   --   ALT 54*  --   --   --   --   --   --   ALKPHOS 56  --   --   --   --   --   --   BILITOT 1.1  --   --   --   --   --   --   ALBUMIN 2.6*  --   --   --   --   --   --   MG  --   --   --  2.1  --   --   --   PROCALCITON  --   --   --   --   --  8.69 6.80  LATICACIDVEN 1.5  --  1.6  --   --   --   --   INR 1.2  --   --   --   --   --   --   BNP  --  393.9*  --   --   --   --   --     ------------------------------------------------------------------------------------------------------------------ No results for input(s): CHOL, HDL, LDLCALC, TRIG, CHOLHDL, LDLDIRECT in the last 72 hours.  No results found for: HGBA1C ------------------------------------------------------------------------------------------------------------------ No results for input(s): TSH, T4TOTAL, T3FREE, THYROIDAB in the last 72 hours.  Invalid input(s): FREET3  Cardiac Enzymes No results for input(s): CKMB, TROPONINI, MYOGLOBIN in the last 168 hours.  Invalid input(s): CK ------------------------------------------------------------------------------------------------------------------    Component Value Date/Time   BNP 393.9 (H) 07/17/2020 1914    Micro Results Recent Results (from the past 240 hour(s))  Culture, blood (Routine x 2)     Status: None (Preliminary result)   Collection Time: 07/17/20  7:30 PM   Specimen:  BLOOD  Result Value Ref Range Status   Specimen Description BLOOD RIGHT ANTECUBITAL  Final   Special Requests   Final    BOTTLES DRAWN  AEROBIC AND ANAEROBIC Blood Culture results may not be optimal due to an inadequate volume of blood received in culture bottles   Culture   Final    NO GROWTH 2 DAYS Performed at [facility] [address] [place], [place] [address]    Report Status PENDING  Incomplete  Culture, blood (Routine x 2)     Status: None (Preliminary result)   Collection Time: 07/17/20  7:35 PM   Specimen: BLOOD LEFT FOREARM  Result Value Ref Range Status   Specimen Description BLOOD LEFT FOREARM  Final   Special Requests   Final    BOTTLES DRAWN AEROBIC AND ANAEROBIC Blood Culture results may not be optimal due to an inadequate volume of blood received in culture bottles   Culture   Final    NO GROWTH 2 DAYS Performed at [facility], [place] [address], [place], [place] [address]    Report Status PENDING  Incomplete  Urine culture     Status: Abnormal   Collection Time: 07/17/20 11:06 PM   Specimen: Urine, Random  Result Value Ref Range Status   Specimen Description URINE, RANDOM  Final   Special Requests   Final    NONE Performed at [facility], [place] [address] [place], [place] [address]    Culture MULTIPLE SPECIES PRESENT, SUGGEST RECOLLECTION (A)  Final   Report Status 07/19/2020 FINAL  Final  Resp Panel by RT-PCR (Flu A&B, Covid) Nasopharyngeal Swab     Status: None   Collection Time: 07/18/20 12:21 AM   Specimen: Nasopharyngeal Swab; Nasopharyngeal(NP) swabs in vial transport medium  Result Value Ref Range Status   SARS Coronavirus 2 by RT PCR NEGATIVE NEGATIVE Final    Comment: (NOTE) SARS-CoV-2 target nucleic acids are NOT DETECTED.  The SARS-CoV-2 RNA is generally detectable in upper respiratory specimens during the acute phase of infection. The lowest concentration of SARS-CoV-2 viral copies this assay can detect is 138 copies/mL. A negative result does not preclude SARS-Cov-2 infection and should not be used as the sole basis for treatment or other patient management  decisions. A negative result may occur with  improper specimen collection/handling, submission of specimen other than nasopharyngeal swab, presence of viral mutation(s) within the areas targeted by this assay, and inadequate number of viral copies(<138 copies/mL). A negative result must be combined with clinical observations, patient history, and epidemiological information. The expected result is Negative.  Fact Sheet for Patients:  EntrepreneurPulse.com.au  Fact Sheet for Healthcare Providers:  IncredibleEmployment.be  This test is no t yet approved or cleared by the [place] FDA and  has been authorized for detection and/or diagnosis of SARS-CoV-2 by FDA under an Emergency Use Authorization (EUA). This EUA will remain  in effect (meaning this test can be used) for the duration of the COVID-19 declaration under Section 564(b)(1) of the Act, 21 U.S.C.section 360bbb-3(b)(1), unless the authorization is terminated  or revoked sooner.       Influenza A by PCR NEGATIVE NEGATIVE Final   Influenza B by PCR NEGATIVE NEGATIVE Final    Comment: (NOTE) The Xpert Xpress SARS-CoV-2/FLU/RSV plus assay is intended as an aid in the diagnosis of influenza from Nasopharyngeal swab specimens and should not be used as a sole basis for treatment. Nasal washings and aspirates are unacceptable for Xpert Xpress  SARS-CoV-2/FLU/RSV testing.  Fact Sheet for Patients: EntrepreneurPulse.com.au  Fact Sheet for Healthcare Providers: IncredibleEmployment.be  This test is not yet approved or cleared by the [place] FDA and has been authorized for detection and/or diagnosis of SARS-CoV-2 by FDA under an Emergency Use Authorization (EUA). This EUA will remain in effect (meaning this test can be used) for the duration of the COVID-19 declaration under Section 564(b)(1) of the Act, 21 U.S.C. section 360bbb-3(b)(1), unless the  authorization is terminated or revoked.  Performed at [facility], [place] [address] [place], [place] [address]   MRSA PCR Screening     Status: None   Collection Time: 07/18/20  8:47 AM   Specimen: Nasopharyngeal  Result Value Ref Range Status   MRSA by PCR NEGATIVE NEGATIVE Final    Comment:        The GeneXpert MRSA Assay (FDA approved for NASAL specimens only), is one component of a comprehensive MRSA colonization surveillance program. It is not intended to diagnose MRSA infection nor to guide or monitor treatment for MRSA infections. Performed at [facility], [place] [address] [place], [place] [address]     Radiology Reports DG Chest 2 View  Result Date: 07/17/2020 CLINICAL DATA:  Shortness of breath. EXAM: CHEST - 2 VIEW COMPARISON:  None. FINDINGS: The heart size and mediastinal contours are within normal limits. No pneumothorax or pleural effusion is noted. Mild interstitial densities are noted in both lung bases concerning for possible pulmonary edema. The visualized skeletal structures are unremarkable. IMPRESSION: Mild bibasilar interstitial densities concerning for possible pulmonary edema. Electronically Signed   By: Marijo Conception M.D.   On: 07/17/2020 20:30   CT Angio Chest PE W and/or Wo Contrast  Result Date: 07/17/2020 CLINICAL DATA:  Shortness of breath EXAM: CT ANGIOGRAPHY CHEST WITH CONTRAST TECHNIQUE: Multidetector CT imaging of the chest was performed using the standard protocol during bolus administration of intravenous contrast. Multiplanar CT image reconstructions and MIPs were obtained to evaluate the vascular anatomy. CONTRAST:  39m OMNIPAQUE IOHEXOL 350 MG/ML SOLN COMPARISON:  None. FINDINGS: Cardiovascular: There is a optimal opacification of the pulmonary arteries. There is no central,segmental, or subsegmental filling defects within the pulmonary arteries. The heart is normal in size. No pericardial effusion or thickening. No evidence right  heart strain. There is normal three-vessel brachiocephalic anatomy without proximal stenosis. The thoracic aorta is normal in appearance. Mediastinum/Nodes: No hilar, mediastinal, or axillary adenopathy. Thyroid gland, trachea, and esophagus demonstrate no significant findings. Lungs/Pleura: Multifocal patchy airspace opacities are seen throughout both lungs predominantly at both lung bases. No pleural effusion or pneumothorax is seen. Upper Abdomen: No acute abnormalities present in the visualized portions of the upper abdomen. There is diffuse low density seen throughout the liver parenchyma Musculoskeletal: No chest wall abnormality. No acute or significant osseous findings. Review of the MIP images confirms the above findings. IMPRESSION: No central, segmental, or subsegmental pulmonary embolism Multifocal patchy airspace opacities throughout both lungs, likely consistent with multifocal pneumonia Electronically Signed   By: BPrudencio PairM.D.   On: 07/17/2020 22:38   ECHOCARDIOGRAM COMPLETE  Result Date: 07/18/2020    ECHOCARDIOGRAM REPORT   Patient Name:   CCREOLA KROTZDate of Exam: 07/18/2020 Medical Rec #:  [id number]         Height:       64.0 in Accession #:    [id number]        Weight:       200.0 lb  Date of Birth:  [birth year]          BSA:          1.956 m Patient Age:    58 years           BP:           120/78 mmHg Patient Gender: F                  HR:           103 bpm. Exam Location:  Inpatient Procedure: 2D Echo, Cardiac Doppler, Color Doppler and Intracardiac            Opacification Agent Indications:    CHF-Acute Systolic X73.53  History:        Patient has no prior history of Echocardiogram examinations.                 Risk Factors:Non-Smoker. Pulmonary embolism.  Sonographer:    Vickie Epley RDCS Referring Phys: [id number] [facility]  Sonographer Comments: Image acquisition challenging due to respiratory motion and Image acquisition challenging due to mastectomy. IMPRESSIONS  1.  Left ventricular ejection fraction, by estimation, is 60 to 65%. The left ventricle has normal function. The left ventricle has no regional wall motion abnormalities. There is moderate left ventricular hypertrophy. Left ventricular diastolic parameters are indeterminate.  2. Right ventricular systolic function is normal. The right ventricular size is normal. Tricuspid regurgitation signal is inadequate for assessing PA pressure.  3. The mitral valve is normal in structure. No evidence of mitral valve regurgitation.  4. The aortic valve was not well visualized. Aortic valve regurgitation is not visualized. No aortic stenosis is present.  5. The inferior vena cava is normal in size with greater than 50% respiratory variability, suggesting right atrial pressure of 3 mmHg. FINDINGS  Left Ventricle: Left ventricular ejection fraction, by estimation, is 60 to 65%. The left ventricle has normal function. The left ventricle has no regional wall motion abnormalities. Definity contrast agent was given IV to delineate the left ventricular  endocardial borders. The left ventricular internal cavity size was normal in size. There is moderate left ventricular hypertrophy. Left ventricular diastolic parameters are indeterminate. Right Ventricle: The right ventricular size is normal. No increase in right ventricular wall thickness. Right ventricular systolic function is normal. Tricuspid regurgitation signal is inadequate for assessing PA pressure. Left Atrium: Left atrial size was normal in size. Right Atrium: Right atrial size was normal in size. Pericardium: Trivial pericardial effusion is present. Mitral Valve: The mitral valve is normal in structure. No evidence of mitral valve regurgitation. Tricuspid Valve: The tricuspid valve is normal in structure. Tricuspid valve regurgitation is not demonstrated. Aortic Valve: The aortic valve was not well visualized. Aortic valve regurgitation is not visualized. No aortic stenosis is  present. Pulmonic Valve: The pulmonic valve was not well visualized. Pulmonic valve regurgitation is trivial. Aorta: The aortic root is normal in size and structure. Venous: The inferior vena cava is normal in size with greater than 50% respiratory variability, suggesting right atrial pressure of 3 mmHg. IAS/Shunts: The interatrial septum was not well visualized.  LEFT VENTRICLE PLAX 2D LVIDd:         4.30 cm     Diastology LVIDs:         2.80 cm     LV e' medial:    5.98 cm/s LV PW:         1.10 cm     LV E/e' medial:  23.6  LV IVS:        1.10 cm     LV e' lateral:   7.40 cm/s LVOT diam:     2.30 cm     LV E/e' lateral: 19.1 LV SV:         98 LV SV Index:   50 LVOT Area:     4.15 cm  LV Volumes (MOD) LV vol d, MOD A4C: 99.7 ml LV vol s, MOD A4C: 36.0 ml LV SV MOD A4C:     99.7 ml RIGHT VENTRICLE RV S prime:     16.60 cm/s TAPSE (M-mode): 2.6 cm LEFT ATRIUM             Index       RIGHT ATRIUM           Index LA diam:        4.60 cm 2.35 cm/m  RA Area:     16.40 cm LA Vol (A2C):   39.0 ml 19.93 ml/m RA Volume:   43.20 ml  22.08 ml/m LA Vol (A4C):   57.8 ml 29.54 ml/m LA Biplane Vol: 48.4 ml 24.74 ml/m  AORTIC VALVE LVOT Vmax:   142.00 cm/s LVOT Vmean:  105.000 cm/s LVOT VTI:    0.236 m  AORTA Ao Root diam: 3.50 cm MITRAL VALVE MV Area (PHT): 3.60 cm     SHUNTS MV Decel Time: 211 msec     Systemic VTI:  0.24 m MV E velocity: 141.00 cm/s  Systemic Diam: 2.30 cm MV A velocity: 107.00 cm/s MV E/A ratio:  1.32 Oswaldo Milian MD Electronically signed by Oswaldo Milian MD Signature Date/Time: 07/18/2020/3:15:36 PM    Final

## 2020-07-19 NOTE — Plan of Care (Signed)
  Problem: Activity: Goal: Ability to tolerate increased activity will improve Outcome: Progressing   Problem: Clinical Measurements: Goal: Ability to maintain a body temperature in the normal range will improve Outcome: Progressing   Problem: Respiratory: Goal: Ability to maintain adequate ventilation will improve Outcome: Progressing   

## 2020-07-20 LAB — PROCALCITONIN: Procalcitonin: 3.91 ng/mL

## 2020-07-20 NOTE — Progress Notes (Signed)
PROGRESS NOTE                                                                             PROGRESS NOTE                                                                                                                                                                                                             Patient Demographics:    Sandra Farmer, is a 55 y.o. female, DOB - 1966-04-03, PHX:[id number]  Outpatient Primary MD for the patient is Patient, No Pcp Per    LOS - 3  Admit date - 07/17/2020    Chief Complaint  Patient presents with  . Code Sepsis       Brief Narrative    NYLANI Sandra Farmer is a 55 y.o. female with medical history significant of PE, arthritis, sleep apnea, gastric bypass surgery, mastectomy presented to the ED with complaints of fever and urinary symptoms.  Febrile with EMS with temperature 102.6 F and slightly tachycardic.  Placed on 2 L supplemental oxygen.  Patient reports 1 week history of dyspnea, cough, and high fevers.  States she is moving here from [place].  Last week she went to an emergency room in [place] as she was vomiting and having pain in her back and they told her she had a urinary tract infection.  She was prescribed an antibiotic but has not been able to pick it up from the pharmacy as she is traveling.  States back pain and vomiting have now resolved.  States she had gastric bypass surgery done 8 weeks ago and recovered quite well.  She is fully vaccinated against COVID including booster shot.  No additional history could be obtained from her at this time.  ED Course: Febrile with temperature 103.2 F.  Tachycardic and tachypneic.  Not hypotensive.  Satting well on 2 L supplemental oxygen.  Labs showing WBC 10.0, hemoglobin 13.0, platelet count 174K.  Sodium 130, potassium 2.5, chloride 95, bicarb 24, BUN 10, creatinine 0.8, glucose 103.  AST 42, ALT 54.  Alk phos and T bili normal.  Lactic acid normal x2.   INR 1.2.  UA without signs of infection.  Urine culture pending.  High-sensitivity troponin 35 >24.  EKG not suggestive of ACS.  BNP 393.  Blood culture x2 pending.  Magnesium 2.1.  SARS-CoV-2 PCR test negative.  Influenza panel negative.    Chest x-ray showing mild bibasilar interstitial densities concerning for possible pulmonary edema.  CT angiogram chest negative for PE.  Showing multifocal patchy airspace opacities throughout both lungs, likely consistent with multifocal pneumonia.  Patient was given Tylenol, fentanyl, IV Lasix 40 mg, oral and IV potassium, ceftriaxone, and a 500 cc fluid bolus.   Subjective:    Annalisa Colonna today reports she is feeling better, she is afebrile overnight, reports cough has improved, report dyspnea has improved as well.     Assessment  & Plan :    Principal Problem:   Multifocal pneumonia Active Problems:   Sepsis ([place])   Hypokalemia   Elevated troponin   Elevated brain natriuretic peptide (BNP) level   Sepsis and acute hypoxemic respiratory failure secondary to multifocal pneumonia: -Sepsis present on admission. -Improved oxygen requirement, she is on 2 L nasal cannula this morning, will wean as tolerated. -Work-up significant for multifocal pulmonary,. -MRSA screen is negative, will discontinue vancomycin, continue cefepime. -Procalcitonin acutely elevated on admission, but it is trending down which is reassuring.  -She was encouraged with incentive spirometry, flutter valve, out of bed to chair - CT angiogram negative for PE but showing findings consistent with multifocal pneumonia.   Hypokalemia : - repelted  Mild troponin elevation:  - non ACS pattern.  Trending down - demand ischemia Due to sepsis and hypoxia.  .  Mild transaminitis: -  Likely due to sepsis.  Alk phos and T bili normal.  Not vomiting at present.  Abdominal exam benign. -Continue to monitor  Recent gastric bypass -Abdominal exam is benign.  SpO2: 96  % O2 Flow Rate (L/min): 2 L/min  Recent Labs  Lab 07/17/20 1858 07/17/20 1914 07/17/20 2309 07/18/20 0021 07/18/20 0515 07/18/20 1748 07/19/20 0327 07/20/20 0245  WBC 10.0  --   --   --  10.0  --  9.3  --   PLT 174  --   --   --  203  --  258  --   BNP  --  393.9*  --   --   --   --   --   --   PROCALCITON  --   --   --   --   --  8.69 6.80 3.91  AST 42*  --   --   --   --   --   --   --   ALT 54*  --   --   --   --   --   --   --   ALKPHOS 56  --   --   --   --   --   --   --   BILITOT 1.1  --   --   --   --   --   --   --   ALBUMIN 2.6*  --   --   --   --   --   --   --   INR 1.2  --   --   --   --   --   --   --   LATICACIDVEN 1.5  --  1.6  --   --   --   --   --   SARSCOV2NAA  --   --   --  NEGATIVE  --   --   --   --        ABG  No results found for: PHART, PCO2ART, PO2ART, HCO3, TCO2, ACIDBASEDEF, O2SAT    Condition - Extremely Guarded  Family Communication  :  None at bedside  Code Status :  Full  Consults  :  none  Disposition Plan  :    Status is: Inpatient  Remains inpatient appropriate because:IV treatments appropriate due to intensity of illness or inability to take PO   Dispo: The patient is from: Home              Anticipated d/c is to: Home              Patient currently is not medically stable to d/c.   Difficult to place patient No      DVT Prophylaxis  :  Lovenox Lab Results  Component Value Date   PLT 258 07/19/2020    Diet :  Diet Order            Diet Heart Room service appropriate? Yes; Fluid consistency: Thin  Diet effective now                  Inpatient Medications  Scheduled Meds: . enoxaparin (LOVENOX) injection  40 mg Subcutaneous Q24H  . melatonin  3 mg Oral QHS  . pantoprazole  40 mg Oral Daily   Continuous Infusions: . ceFEPime (MAXIPIME) IV 2 g (07/20/20 0917)   PRN Meds:.acetaminophen, ALPRAZolam  Antibiotics  :    Anti-infectives (From admission, onward)   Start     Dose/Rate Route Frequency  Ordered Stop   07/19/20 0600  vancomycin (VANCOREADY) IVPB 1250 mg/250 mL  Status:  Discontinued        1,250 mg 166.7 mL/hr over 90 Minutes Intravenous Every 24 hours 07/18/20 0529 07/20/20 0708   07/18/20 0800  ceFEPIme (MAXIPIME) 2 g in sodium chloride 0.9 % 100 mL IVPB        2 g 200 mL/hr over 30 Minutes Intravenous Every 8 hours 07/18/20 0523     07/18/20 0530  vancomycin (VANCOREADY) IVPB 2000 mg/400 mL        2,000 mg 200 mL/hr over 120 Minutes Intravenous  Once 07/18/20 0523 07/18/20 0813   07/17/20 2200  cefTRIAXone (ROCEPHIN) 2 g in sodium chloride 0.9 % 100 mL IVPB  Status:  Discontinued        2 g 200 mL/hr over 30 Minutes Intravenous Every 24 hours 07/17/20 2155 07/18/20 0517       Emeline Gins Vipul Cafarelli M.D on 07/20/2020 at 1:41 PM  To page go to [email or web address]   [facility] -  Office  [phone]      Objective:   Vitals:   07/19/20 2358 07/20/20 0250 07/20/20 0724 07/20/20 1149  BP:  132/75 113/78 123/84  Pulse:  87 83 88  Resp:  20 (!) 22 20  Temp: 99 F (37.2 C) 97.8 F (36.6 C) 98.3 F (36.8 C) 98 F (36.7 C)  TempSrc: Oral Oral Oral Oral  SpO2:  95% 96% 96%  Weight:      Height:        Wt Readings from Last 3 Encounters:  07/17/20 90.7 kg     Intake/Output Summary (Last 24 hours) at 07/20/2020 1341 Last data filed at  07/20/2020 0917 Gross per 24 hour  Intake 1186.81 ml  Output --  Net 1186.81 ml     Physical Exam  Awake Alert, Oriented X 3, No new F.N deficits, Normal affect Symmetrical Chest wall movement, Good air movement bilaterally, scattered rales RRR,No Gallops,Rubs or new Murmurs, No Parasternal Heave +ve B.Sounds, Abd Soft, No tenderness, No rebound - guarding or rigidity. No Cyanosis, Clubbing or edema, No new Rash or bruise        Data Review:    CBC Recent Labs  Lab 07/17/20 1858 07/18/20 0515 07/19/20 0327  WBC 10.0 10.0 9.3  HGB 13.0 12.5 12.6  HCT 36.8 36.5 37.4  PLT 174 203 258  MCV 82.5 83.9 84.0   MCH 29.1 28.7 28.3  MCHC 35.3 34.2 33.7  RDW 17.5* 17.9* 18.5*  LYMPHSABS 0.7  --   --   MONOABS 0.9  --   --   EOSABS 0.0  --   --   BASOSABS 0.0  --   --     Recent Labs  Lab 07/17/20 1858 07/17/20 1914 07/17/20 2309 07/17/20 2310 07/18/20 0515 07/18/20 1748 07/19/20 0327 07/20/20 0245  NA 130*  --   --   --  129*  --  132*  --   K 2.5*  --   --   --  3.9  --  4.0  --   CL 95*  --   --   --  96*  --  98  --   CO2 24  --   --   --  22  --  23  --   GLUCOSE 103*  --   --   --  87  --  80  --   BUN 10  --   --   --  9  --  10  --   CREATININE 0.82  --   --   --  0.76  --  0.65  --   CALCIUM 7.9*  --   --   --  7.3*  --  8.0*  --   AST 42*  --   --   --   --   --   --   --   ALT 54*  --   --   --   --   --   --   --   ALKPHOS 56  --   --   --   --   --   --   --   BILITOT 1.1  --   --   --   --   --   --   --   ALBUMIN 2.6*  --   --   --   --   --   --   --   MG  --   --   --  2.1  --   --   --   --   PROCALCITON  --   --   --   --   --  8.69 6.80 3.91  LATICACIDVEN 1.5  --  1.6  --   --   --   --   --   INR 1.2  --   --   --   --   --   --   --   BNP  --  393.9*  --   --   --   --   --   --     ------------------------------------------------------------------------------------------------------------------ No results for input(s):  CHOL, HDL, LDLCALC, TRIG, CHOLHDL, LDLDIRECT in the last 72 hours.  No results found for: HGBA1C ------------------------------------------------------------------------------------------------------------------ No results for input(s): TSH, T4TOTAL, T3FREE, THYROIDAB in the last 72 hours.  Invalid input(s): FREET3  Cardiac Enzymes No results for input(s): CKMB, TROPONINI, MYOGLOBIN in the last 168 hours.  Invalid input(s): CK ------------------------------------------------------------------------------------------------------------------    Component Value Date/Time   BNP 393.9 (H) 07/17/2020 1914    Micro Results Recent  Results (from the past 240 hour(s))  Culture, blood (Routine x 2)     Status: None (Preliminary result)   Collection Time: 07/17/20  7:30 PM   Specimen: BLOOD  Result Value Ref Range Status   Specimen Description BLOOD RIGHT ANTECUBITAL  Final   Special Requests   Final    BOTTLES DRAWN AEROBIC AND ANAEROBIC Blood Culture results may not be optimal due to an inadequate volume of blood received in culture bottles   Culture   Final    NO GROWTH 3 DAYS Performed at [facility], [place] [address] [place], [place] [address]    Report Status PENDING  Incomplete  Culture, blood (Routine x 2)     Status: None (Preliminary result)   Collection Time: 07/17/20  7:35 PM   Specimen: BLOOD LEFT FOREARM  Result Value Ref Range Status   Specimen Description BLOOD LEFT FOREARM  Final   Special Requests   Final    BOTTLES DRAWN AEROBIC AND ANAEROBIC Blood Culture results may not be optimal due to an inadequate volume of blood received in culture bottles   Culture   Final    NO GROWTH 3 DAYS Performed at [facility], [place] [address], [place], [place] [address]    Report Status PENDING  Incomplete  Urine culture     Status: Abnormal   Collection Time: 07/17/20 11:06 PM   Specimen: Urine, Random  Result Value Ref Range Status   Specimen Description URINE, RANDOM  Final   Special Requests   Final    NONE Performed at [facility], [place] [address], [place],  [address]    Culture MULTIPLE SPECIES PRESENT, SUGGEST RECOLLECTION (A)  Final   Report Status 07/19/2020 FINAL  Final  Resp Panel by RT-PCR (Flu A&B, Covid) Nasopharyngeal Swab     Status: None   Collection Time: 07/18/20 12:21 AM   Specimen: Nasopharyngeal Swab; Nasopharyngeal(NP) swabs in vial transport medium  Result Value Ref Range Status   SARS Coronavirus 2 by RT PCR NEGATIVE NEGATIVE Final    Comment: (NOTE) SARS-CoV-2 target nucleic acids are NOT DETECTED.  The SARS-CoV-2 RNA is generally detectable in upper  respiratory specimens during the acute phase of infection. The lowest concentration of SARS-CoV-2 viral copies this assay can detect is 138 copies/mL. A negative result does not preclude SARS-Cov-2 infection and should not be used as the sole basis for treatment or other patient management decisions. A negative result may occur with  improper specimen collection/handling, submission of specimen other than nasopharyngeal swab, presence of viral mutation(s) within the areas targeted by this assay, and inadequate number of viral copies(<138 copies/mL). A negative result must be combined with clinical observations, patient history, and epidemiological information. The expected result is Negative.  Fact Sheet for Patients:  EntrepreneurPulse.com.au  Fact Sheet for Healthcare Providers:  IncredibleEmployment.be  This test is no t yet approved or cleared by the [place] FDA and  has been authorized for detection and/or diagnosis of SARS-CoV-2 by FDA under an Emergency Use Authorization (EUA).  This EUA will remain  in effect (meaning this test can be used) for the duration of the COVID-19 declaration under Section 564(b)(1) of the Act, 21 U.S.C.section 360bbb-3(b)(1), unless the authorization is terminated  or revoked sooner.       Influenza A by PCR NEGATIVE NEGATIVE Final   Influenza B by PCR NEGATIVE NEGATIVE Final    Comment: (NOTE) The Xpert Xpress SARS-CoV-2/FLU/RSV plus assay is intended as an aid in the diagnosis of influenza from Nasopharyngeal swab specimens and should not be used as a sole basis for treatment. Nasal washings and aspirates are unacceptable for Xpert Xpress SARS-CoV-2/FLU/RSV testing.  Fact Sheet for Patients: EntrepreneurPulse.com.au  Fact Sheet for Healthcare Providers: IncredibleEmployment.be  This test is not yet approved or cleared by the [place] FDA and has been  authorized for detection and/or diagnosis of SARS-CoV-2 by FDA under an Emergency Use Authorization (EUA). This EUA will remain in effect (meaning this test can be used) for the duration of the COVID-19 declaration under Section 564(b)(1) of the Act, 21 U.S.C. section 360bbb-3(b)(1), unless the authorization is terminated or revoked.  Performed at [facility], [place] [address], [place], [place] [address]   MRSA PCR Screening     Status: None   Collection Time: 07/18/20  8:47 AM   Specimen: Nasopharyngeal  Result Value Ref Range Status   MRSA by PCR NEGATIVE NEGATIVE Final    Comment:        The GeneXpert MRSA Assay (FDA approved for NASAL specimens only), is one component of a comprehensive MRSA colonization surveillance program. It is not intended to diagnose MRSA infection nor to guide or monitor treatment for MRSA infections. Performed at [facility], [place] [address], [place], [place] [address]     Radiology Reports DG Chest 2 View  Result Date: 07/17/2020 CLINICAL DATA:  Shortness of breath. EXAM: CHEST - 2 VIEW COMPARISON:  None. FINDINGS: The heart size and mediastinal contours are within normal limits. No pneumothorax or pleural effusion is noted. Mild interstitial densities are noted in both lung bases concerning for possible pulmonary edema. The visualized skeletal structures are unremarkable. IMPRESSION: Mild bibasilar interstitial densities concerning for possible pulmonary edema. Electronically Signed   By: Marijo Conception M.D.   On: 07/17/2020 20:30   CT Angio Chest PE W and/or Wo Contrast  Result Date: 07/17/2020 CLINICAL DATA:  Shortness of breath EXAM: CT ANGIOGRAPHY CHEST WITH CONTRAST TECHNIQUE: Multidetector CT imaging of the chest was performed using the standard protocol during bolus administration of intravenous contrast. Multiplanar CT image reconstructions and MIPs were obtained to evaluate the vascular anatomy. CONTRAST:  64m OMNIPAQUE IOHEXOL  350 MG/ML SOLN COMPARISON:  None. FINDINGS: Cardiovascular: There is a optimal opacification of the pulmonary arteries. There is no central,segmental, or subsegmental filling defects within the pulmonary arteries. The heart is normal in size. No pericardial effusion or thickening. No evidence right heart strain. There is normal three-vessel brachiocephalic anatomy without proximal stenosis. The thoracic aorta is normal in appearance. Mediastinum/Nodes: No hilar, mediastinal, or axillary adenopathy. Thyroid gland, trachea, and esophagus demonstrate no significant findings. Lungs/Pleura: Multifocal patchy airspace opacities are seen throughout both lungs predominantly at both lung bases. No pleural effusion or pneumothorax is seen. Upper Abdomen: No acute abnormalities present in the visualized portions of the upper abdomen. There is diffuse low density seen throughout the liver parenchyma Musculoskeletal: No chest wall abnormality. No acute or significant osseous findings. Review of the MIP images confirms the above findings. IMPRESSION:  No central, segmental, or subsegmental pulmonary embolism Multifocal patchy airspace opacities throughout both lungs, likely consistent with multifocal pneumonia Electronically Signed   By: Prudencio Pair M.D.   On: 07/17/2020 22:38   ECHOCARDIOGRAM COMPLETE  Result Date: 07/18/2020    ECHOCARDIOGRAM REPORT   Patient Name:   COLLIN HENDLEY Date of Exam: 07/18/2020 Medical Rec #:  [id number]          Height:       64.0 in Accession #:    [id number]         Weight:       200.0 lb Date of Birth:  1965/08/23          BSA:          1.956 m Patient Age:    7 years           BP:           120/78 mmHg Patient Gender: F                  HR:           103 bpm. Exam Location:  Inpatient Procedure: 2D Echo, Cardiac Doppler, Color Doppler and Intracardiac            Opacification Agent Indications:    CHF-Acute Systolic H54.56  History:        Patient has no prior history of Echocardiogram  examinations.                 Risk Factors:Non-Smoker. Pulmonary embolism.  Sonographer:    Vickie Epley RDCS Referring Phys: [id number] [facility]  Sonographer Comments: Image acquisition challenging due to respiratory motion and Image acquisition challenging due to mastectomy. IMPRESSIONS  1. Left ventricular ejection fraction, by estimation, is 60 to 65%. The left ventricle has normal function. The left ventricle has no regional wall motion abnormalities. There is moderate left ventricular hypertrophy. Left ventricular diastolic parameters are indeterminate.  2. Right ventricular systolic function is normal. The right ventricular size is normal. Tricuspid regurgitation signal is inadequate for assessing PA pressure.  3. The mitral valve is normal in structure. No evidence of mitral valve regurgitation.  4. The aortic valve was not well visualized. Aortic valve regurgitation is not visualized. No aortic stenosis is present.  5. The inferior vena cava is normal in size with greater than 50% respiratory variability, suggesting right atrial pressure of 3 mmHg. FINDINGS  Left Ventricle: Left ventricular ejection fraction, by estimation, is 60 to 65%. The left ventricle has normal function. The left ventricle has no regional wall motion abnormalities. Definity contrast agent was given IV to delineate the left ventricular  endocardial borders. The left ventricular internal cavity size was normal in size. There is moderate left ventricular hypertrophy. Left ventricular diastolic parameters are indeterminate. Right Ventricle: The right ventricular size is normal. No increase in right ventricular wall thickness. Right ventricular systolic function is normal. Tricuspid regurgitation signal is inadequate for assessing PA pressure. Left Atrium: Left atrial size was normal in size. Right Atrium: Right atrial size was normal in size. Pericardium: Trivial pericardial effusion is present. Mitral Valve: The mitral valve is  normal in structure. No evidence of mitral valve regurgitation. Tricuspid Valve: The tricuspid valve is normal in structure. Tricuspid valve regurgitation is not demonstrated. Aortic Valve: The aortic valve was not well visualized. Aortic valve regurgitation is not visualized. No aortic stenosis is present. Pulmonic Valve: The pulmonic valve was not well visualized. Pulmonic valve regurgitation is trivial.  Aorta: The aortic root is normal in size and structure. Venous: The inferior vena cava is normal in size with greater than 50% respiratory variability, suggesting right atrial pressure of 3 mmHg. IAS/Shunts: The interatrial septum was not well visualized.  LEFT VENTRICLE PLAX 2D LVIDd:         4.30 cm     Diastology LVIDs:         2.80 cm     LV e' medial:    5.98 cm/s LV PW:         1.10 cm     LV E/e' medial:  23.6 LV IVS:        1.10 cm     LV e' lateral:   7.40 cm/s LVOT diam:     2.30 cm     LV E/e' lateral: 19.1 LV SV:         98 LV SV Index:   50 LVOT Area:     4.15 cm  LV Volumes (MOD) LV vol d, MOD A4C: 99.7 ml LV vol s, MOD A4C: 36.0 ml LV SV MOD A4C:     99.7 ml RIGHT VENTRICLE RV S prime:     16.60 cm/s TAPSE (M-mode): 2.6 cm LEFT ATRIUM             Index       RIGHT ATRIUM           Index LA diam:        4.60 cm 2.35 cm/m  RA Area:     16.40 cm LA Vol (A2C):   39.0 ml 19.93 ml/m RA Volume:   43.20 ml  22.08 ml/m LA Vol (A4C):   57.8 ml 29.54 ml/m LA Biplane Vol: 48.4 ml 24.74 ml/m  AORTIC VALVE LVOT Vmax:   142.00 cm/s LVOT Vmean:  105.000 cm/s LVOT VTI:    0.236 m  AORTA Ao Root diam: 3.50 cm MITRAL VALVE MV Area (PHT): 3.60 cm     SHUNTS MV Decel Time: 211 msec     Systemic VTI:  0.24 m MV E velocity: 141.00 cm/s  Systemic Diam: 2.30 cm MV A velocity: 107.00 cm/s MV E/A ratio:  1.32 Oswaldo Milian MD Electronically signed by Oswaldo Milian MD Signature Date/Time: 07/18/2020/3:15:36 PM    Final

## 2020-07-21 MED ORDER — LEVOFLOXACIN 750 MG PO TABS: 750.0000 mg | ORAL_TABLET | Freq: Every day | ORAL | 0 refills | Status: AC

## 2020-07-21 NOTE — Discharge Summary (Addendum)
Sandra Farmer, is a 55 y.o. female  DOB Jun 01, 1965  MRN [id number].  Admission date:  07/17/2020  Admitting Physician  Shela Leff, MD  Discharge Date:  07/21/2020   Primary MD  Patient, No Pcp Per  Recommendations for primary care physician for things to follow:  -Please check CBC, CMP during next visit. -Please repeat 2 view chest x-ray in 4 to 6 weeks to ensure resolution of pneumonia  Admission Diagnosis  Acute pulmonary edema ([place]) [J81.0] Prolonged Q-T interval on ECG [R94.31] Febrile illness [R50.9] Multifocal pneumonia [J18.9]   Discharge Diagnosis  Acute pulmonary edema ([place]) [J81.0] Prolonged Q-T interval on ECG [R94.31] Febrile illness [R50.9] Multifocal pneumonia [J18.9]    Principal Problem:   Multifocal pneumonia Active Problems:   Sepsis ([place])   Hypokalemia   Elevated troponin   Elevated brain natriuretic peptide (BNP) level      Past Medical History:  Diagnosis Date  . Anxiety   . Arthritis   . Blood transfusion without reported diagnosis   . Breast cancer ([place]) 2018  . Depression   . Pulmonary emboli ([place])   . Sepsis ([place])   . Sleep apnea     Past Surgical History:  Procedure Laterality Date  . GASTRIC BYPASS    . MASTECTOMY Bilateral        History of present illness and  Hospital Course:     Kindly see H&P for history of present illness and admission details, please review complete Labs, Consult reports and Test reports for all details in brief  HPI  from the history and physical done on the day of admission 07/17/2020  HPI: Sandra Farmer is a 55 y.o. female with medical history significant of PE, arthritis, sleep apnea, gastric bypass surgery, mastectomy presented to the ED with complaints of fever and urinary symptoms.  Febrile with EMS with temperature 102.6 F and slightly tachycardic.  Placed on 2 L supplemental oxygen.  Patient reports  1 week history of dyspnea, cough, and high fevers.  States she is moving here from [place].  Last week she went to an emergency room in [place] as she was vomiting and having pain in her back and they told her she had a urinary tract infection.  She was prescribed an antibiotic but has not been able to pick it up from the pharmacy as she is traveling.  States back pain and vomiting have now resolved.  States she had gastric bypass surgery done 8 weeks ago and recovered quite well.  She is fully vaccinated against COVID including booster shot.  No additional history could be obtained from her at this time.  ED Course: Febrile with temperature 103.2 F.  Tachycardic and tachypneic.  Not hypotensive.  Satting well on 2 L supplemental oxygen.  Labs showing WBC 10.0, hemoglobin 13.0, platelet count 174K.  Sodium 130, potassium 2.5, chloride 95, bicarb 24, BUN 10, creatinine 0.8, glucose 103.  AST 42, ALT 54.  Alk phos and T bili normal.  Lactic acid normal x2.  INR 1.2.  UA without signs of infection.  Urine culture pending.  High-sensitivity troponin 35 >24.  EKG not suggestive of ACS.  BNP 393.  Blood culture x2 pending.  Magnesium 2.1.  SARS-CoV-2 PCR test negative.  Influenza panel negative.    Chest x-ray showing mild bibasilar interstitial densities concerning for possible pulmonary edema.  CT angiogram chest negative for PE.  Showing multifocal patchy airspace opacities throughout both lungs, likely consistent with multifocal pneumonia.  Patient was given Tylenol, fentanyl, IV Lasix 40 mg, oral and IV potassium, ceftriaxone, and a 500 cc fluid bolus.  Hospital Course    Sepsis and acute hypoxemic respiratory failure secondary to multifocal pneumonia due to Legionella: -Sepsis present on admission.  Her work-up was significant for multifocal pneumonia, as well she was hypoxic requiring 2 L nasal cannula, she was treated with broad-spectrum antibiotic including vancomycin, cefepime,  vancomycin has been stopped after MRSA screen is negative, she was kept on cefepime, she was with high fever for first 24 hours, this has resolved, culture came back negative, dyspnea has resolved, no further hypoxia, ambulated in the hallway today 96% on room air, patient reports she is feeling better and requesting to go home, which I think is very appropriate, she will be discharged home on levofloxacin to finish total of 7 days treatment, she was encouraged to take incentive spirometry and flutter valve with her and keep using it. -Procalcitonin  trending down which is reassuring.  -CT angiogram negative for PE but showing findings consistent with multifocal pneumonia.  Addendum 07/22/2020: -Urine Juliane antigen came back positive, so I have called the patient informed her about these results, I have called her pharmacy of choice at [phone], and extended her levofloxacin prescription for another 7 days, to receive total of 10 days of levofloxacin.  Hypokalemia : - repelted  Mild troponin elevation:  - non ACS pattern.  Trending down - demand ischemia Due to sepsis and hypoxia.  .  Mild transaminitis: -  Likely due to sepsis. Alk phos and T bili normal. Not vomiting at present. Abdominal exam benign. -Continue to monitor  Recent gastric bypass -Abdominal exam is benign.  She was instructed to continue her supplements.      Discharge Condition:  stable    Discharge Instructions  and  Discharge Medications    Discharge Instructions    Diet - low sodium heart healthy   Complete by: As directed    Discharge instructions   Complete by: As directed    Follow with Primary MD_0 in 7 days   Get CBC, CMP, 2 view Chest X ray checked  by Primary MD next visit.    Activity: As tolerated with Full fall precautions use walker/cane & assistance as needed   Disposition Home    Diet: Heart Healthy  .   On your next visit with your primary care physician please Get  Medicines reviewed and adjusted.   Please request your Prim.MD to go over all Hospital Tests and Procedure/Radiological results at the follow up, please get all Hospital records sent to your Prim MD by signing hospital release before you go home.   If you experience worsening of your admission symptoms, develop shortness of breath, life threatening emergency, suicidal or homicidal thoughts you must seek medical attention immediately by calling 911 or calling your MD immediately  if symptoms less severe.  You Must read complete instructions/literature along with all the possible adverse reactions/side effects for all the Medicines you take and that have  been prescribed to you. Take any new Medicines after you have completely understood and accpet all the possible adverse reactions/side effects.   Do not drive, operating heavy machinery, perform activities at heights, swimming or participation in water activities or provide baby sitting services if your were admitted for syncope or siezures until you have seen by Primary MD or a Neurologist and advised to do so again.  Do not drive when taking Pain medications.    Do not take more than prescribed Pain, Sleep and Anxiety Medications  Special Instructions: If you have smoked or chewed Tobacco  in the last 2 yrs please stop smoking, stop any regular Alcohol  and or any Recreational drug use.  Wear Seat belts while driving.   Please note  You were cared for by a hospitalist during your hospital stay. If you have any questions about your discharge medications or the care you received while you were in the hospital after you are discharged, you can call the unit and asked to speak with the hospitalist on call if the hospitalist that took care of you is not available. Once you are discharged, your primary care physician will handle any further medical issues. Please note that NO REFILLS for any discharge medications will be authorized once you are  discharged, as it is imperative that you return to your primary care physician (or establish a relationship with a primary care physician if you do not have one) for your aftercare needs so that they can reassess your need for medications and monitor your lab values.   Increase activity slowly   Complete by: As directed      Allergies as of 07/21/2020      Reactions   Other Rash   Surgical tape      Medication List    TAKE these medications   levofloxacin 750 MG tablet Commonly known as: Levaquin Take 1 tablet (750 mg total) by mouth daily for 3 days.   One-A-Day Womens 50+ Tabs Take 1 tablet by mouth daily.   VITAMIN B 12 PO Take 1 tablet by mouth daily.         Diet and Activity recommendation: See Discharge Instructions above   Consults obtained -  None   Major procedures and Radiology Reports - PLEASE review detailed and final reports for all details, in brief -     DG Chest 2 View  Result Date: 07/17/2020 CLINICAL DATA:  Shortness of breath. EXAM: CHEST - 2 VIEW COMPARISON:  None. FINDINGS: The heart size and mediastinal contours are within normal limits. No pneumothorax or pleural effusion is noted. Mild interstitial densities are noted in both lung bases concerning for possible pulmonary edema. The visualized skeletal structures are unremarkable. IMPRESSION: Mild bibasilar interstitial densities concerning for possible pulmonary edema. Electronically Signed   By: Marijo Conception M.D.   On: 07/17/2020 20:30   CT Angio Chest PE W and/or Wo Contrast  Result Date: 07/17/2020 CLINICAL DATA:  Shortness of breath EXAM: CT ANGIOGRAPHY CHEST WITH CONTRAST TECHNIQUE: Multidetector CT imaging of the chest was performed using the standard protocol during bolus administration of intravenous contrast. Multiplanar CT image reconstructions and MIPs were obtained to evaluate the vascular anatomy. CONTRAST:  90m OMNIPAQUE IOHEXOL 350 MG/ML SOLN COMPARISON:  None. FINDINGS:  Cardiovascular: There is a optimal opacification of the pulmonary arteries. There is no central,segmental, or subsegmental filling defects within the pulmonary arteries. The heart is normal in size. No pericardial effusion or thickening. No evidence right heart  strain. There is normal three-vessel brachiocephalic anatomy without proximal stenosis. The thoracic aorta is normal in appearance. Mediastinum/Nodes: No hilar, mediastinal, or axillary adenopathy. Thyroid gland, trachea, and esophagus demonstrate no significant findings. Lungs/Pleura: Multifocal patchy airspace opacities are seen throughout both lungs predominantly at both lung bases. No pleural effusion or pneumothorax is seen. Upper Abdomen: No acute abnormalities present in the visualized portions of the upper abdomen. There is diffuse low density seen throughout the liver parenchyma Musculoskeletal: No chest wall abnormality. No acute or significant osseous findings. Review of the MIP images confirms the above findings. IMPRESSION: No central, segmental, or subsegmental pulmonary embolism Multifocal patchy airspace opacities throughout both lungs, likely consistent with multifocal pneumonia Electronically Signed   By: Prudencio Pair M.D.   On: 07/17/2020 22:38   ECHOCARDIOGRAM COMPLETE  Result Date: 07/18/2020    ECHOCARDIOGRAM REPORT   Patient Name:   JAHLEAH MARISCAL Date of Exam: 07/18/2020 Medical Rec #:  [id number]          Height:       64.0 in Accession #:    [id number]         Weight:       200.0 lb Date of Birth:  25-Oct-1965          BSA:          1.956 m Patient Age:    31 years           BP:           120/78 mmHg Patient Gender: F                  HR:           103 bpm. Exam Location:  Inpatient Procedure: 2D Echo, Cardiac Doppler, Color Doppler and Intracardiac            Opacification Agent Indications:    CHF-Acute Systolic I77.82  History:        Patient has no prior history of Echocardiogram examinations.                 Risk  Factors:Non-Smoker. Pulmonary embolism.  Sonographer:    Vickie Epley RDCS Referring Phys: [id number] [facility]  Sonographer Comments: Image acquisition challenging due to respiratory motion and Image acquisition challenging due to mastectomy. IMPRESSIONS  1. Left ventricular ejection fraction, by estimation, is 60 to 65%. The left ventricle has normal function. The left ventricle has no regional wall motion abnormalities. There is moderate left ventricular hypertrophy. Left ventricular diastolic parameters are indeterminate.  2. Right ventricular systolic function is normal. The right ventricular size is normal. Tricuspid regurgitation signal is inadequate for assessing PA pressure.  3. The mitral valve is normal in structure. No evidence of mitral valve regurgitation.  4. The aortic valve was not well visualized. Aortic valve regurgitation is not visualized. No aortic stenosis is present.  5. The inferior vena cava is normal in size with greater than 50% respiratory variability, suggesting right atrial pressure of 3 mmHg. FINDINGS  Left Ventricle: Left ventricular ejection fraction, by estimation, is 60 to 65%. The left ventricle has normal function. The left ventricle has no regional wall motion abnormalities. Definity contrast agent was given IV to delineate the left ventricular  endocardial borders. The left ventricular internal cavity size was normal in size. There is moderate left ventricular hypertrophy. Left ventricular diastolic parameters are indeterminate. Right Ventricle: The right ventricular size is normal. No increase in right ventricular wall thickness. Right ventricular systolic function  is normal. Tricuspid regurgitation signal is inadequate for assessing PA pressure. Left Atrium: Left atrial size was normal in size. Right Atrium: Right atrial size was normal in size. Pericardium: Trivial pericardial effusion is present. Mitral Valve: The mitral valve is normal in structure. No evidence of  mitral valve regurgitation. Tricuspid Valve: The tricuspid valve is normal in structure. Tricuspid valve regurgitation is not demonstrated. Aortic Valve: The aortic valve was not well visualized. Aortic valve regurgitation is not visualized. No aortic stenosis is present. Pulmonic Valve: The pulmonic valve was not well visualized. Pulmonic valve regurgitation is trivial. Aorta: The aortic root is normal in size and structure. Venous: The inferior vena cava is normal in size with greater than 50% respiratory variability, suggesting right atrial pressure of 3 mmHg. IAS/Shunts: The interatrial septum was not well visualized.  LEFT VENTRICLE PLAX 2D LVIDd:         4.30 cm     Diastology LVIDs:         2.80 cm     LV e' medial:    5.98 cm/s LV PW:         1.10 cm     LV E/e' medial:  23.6 LV IVS:        1.10 cm     LV e' lateral:   7.40 cm/s LVOT diam:     2.30 cm     LV E/e' lateral: 19.1 LV SV:         98 LV SV Index:   50 LVOT Area:     4.15 cm  LV Volumes (MOD) LV vol d, MOD A4C: 99.7 ml LV vol s, MOD A4C: 36.0 ml LV SV MOD A4C:     99.7 ml RIGHT VENTRICLE RV S prime:     16.60 cm/s TAPSE (M-mode): 2.6 cm LEFT ATRIUM             Index       RIGHT ATRIUM           Index LA diam:        4.60 cm 2.35 cm/m  RA Area:     16.40 cm LA Vol (A2C):   39.0 ml 19.93 ml/m RA Volume:   43.20 ml  22.08 ml/m LA Vol (A4C):   57.8 ml 29.54 ml/m LA Biplane Vol: 48.4 ml 24.74 ml/m  AORTIC VALVE LVOT Vmax:   142.00 cm/s LVOT Vmean:  105.000 cm/s LVOT VTI:    0.236 m  AORTA Ao Root diam: 3.50 cm MITRAL VALVE MV Area (PHT): 3.60 cm     SHUNTS MV Decel Time: 211 msec     Systemic VTI:  0.24 m MV E velocity: 141.00 cm/s  Systemic Diam: 2.30 cm MV A velocity: 107.00 cm/s MV E/A ratio:  1.32 Oswaldo Milian MD Electronically signed by Oswaldo Milian MD Signature Date/Time: 07/18/2020/3:15:36 PM    Final     Micro Results    Recent Results (from the past 240 hour(s))  Culture, blood (Routine x 2)     Status: None  (Preliminary result)   Collection Time: 07/17/20  7:30 PM   Specimen: BLOOD  Result Value Ref Range Status   Specimen Description BLOOD RIGHT ANTECUBITAL  Final   Special Requests   Final    BOTTLES DRAWN AEROBIC AND ANAEROBIC Blood Culture results may not be optimal due to an inadequate volume of blood received in culture bottles   Culture   Final    NO GROWTH 3 DAYS Performed at [facility], [place] [address], [place], [place] [address]    Report Status PENDING  Incomplete  Culture, blood (Routine x 2)     Status: None (Preliminary result)   Collection Time: 07/17/20  7:35 PM   Specimen: BLOOD LEFT FOREARM  Result Value Ref Range Status   Specimen Description BLOOD LEFT FOREARM  Final   Special Requests   Final    BOTTLES DRAWN AEROBIC AND ANAEROBIC Blood Culture results may not be optimal due to an inadequate volume of blood received in culture bottles   Culture   Final    NO GROWTH 3 DAYS Performed at [facility], [place] [address], [place], [place] [address]    Report Status PENDING  Incomplete  Urine culture     Status: Abnormal   Collection Time: 07/17/20 11:06 PM   Specimen: Urine, Random  Result Value Ref Range Status   Specimen Description URINE, RANDOM  Final   Special Requests   Final    NONE Performed at [facility], [place] [address], [place], [place] [address]    Culture MULTIPLE SPECIES PRESENT, SUGGEST RECOLLECTION (A)  Final   Report Status 07/19/2020 FINAL  Final  Resp Panel by RT-PCR (Flu A&B, Covid) Nasopharyngeal Swab     Status: None   Collection Time: 07/18/20 12:21 AM   Specimen: Nasopharyngeal Swab; Nasopharyngeal(NP) swabs in vial transport medium  Result Value Ref Range Status   SARS Coronavirus 2 by RT PCR NEGATIVE NEGATIVE Final    Comment: (NOTE) SARS-CoV-2 target nucleic acids are NOT DETECTED.  The SARS-CoV-2 RNA is generally detectable in upper respiratory specimens during the acute phase of infection. The lowest concentration  of SARS-CoV-2 viral copies this assay can detect is 138 copies/mL. A negative result does not preclude SARS-Cov-2 infection and should not be used as the sole basis for treatment or other patient management decisions. A negative result may occur with  improper specimen collection/handling, submission of specimen other than nasopharyngeal swab, presence of viral mutation(s) within the areas targeted by this assay, and inadequate number of viral copies(<138 copies/mL). A negative result must be combined with clinical observations, patient history, and epidemiological information. The expected result is Negative.  Fact Sheet for Patients:  EntrepreneurPulse.com.au  Fact Sheet for Healthcare Providers:  IncredibleEmployment.be  This test is no t yet approved or cleared by the [place] FDA and  has been authorized for detection and/or diagnosis of SARS-CoV-2 by FDA under an Emergency Use Authorization (EUA). This EUA will remain  in effect (meaning this test can be used) for the duration of the COVID-19 declaration under Section 564(b)(1) of the Act, 21 U.S.C.section 360bbb-3(b)(1), unless the authorization is terminated  or revoked sooner.       Influenza A by PCR NEGATIVE NEGATIVE Final   Influenza B by PCR NEGATIVE NEGATIVE Final    Comment: (NOTE) The Xpert Xpress SARS-CoV-2/FLU/RSV plus assay is intended as an aid in the diagnosis of influenza from Nasopharyngeal swab specimens and should not be used as a sole basis for treatment. Nasal washings and aspirates are unacceptable for Xpert Xpress SARS-CoV-2/FLU/RSV testing.  Fact Sheet for Patients: EntrepreneurPulse.com.au  Fact Sheet for Healthcare Providers: IncredibleEmployment.be  This test is not yet approved or cleared by the [place] FDA and has been authorized for detection and/or diagnosis of SARS-CoV-2 by FDA under an Emergency Use  Authorization (EUA). This EUA will remain in effect (meaning this test can be used) for the duration of the COVID-19 declaration  under Section 564(b)(1) of the Act, 21 U.S.C. section 360bbb-3(b)(1), unless the authorization is terminated or revoked.  Performed at [facility], [place] [address], [place], [place] [address]   MRSA PCR Screening     Status: None   Collection Time: 07/18/20  8:47 AM   Specimen: Nasopharyngeal  Result Value Ref Range Status   MRSA by PCR NEGATIVE NEGATIVE Final    Comment:        The GeneXpert MRSA Assay (FDA approved for NASAL specimens only), is one component of a comprehensive MRSA colonization surveillance program. It is not intended to diagnose MRSA infection nor to guide or monitor treatment for MRSA infections. Performed at [facility], [place] [address] [place], [place] [address]        Today   Subjective:   Yaeko Fazekas today has no headache, no chest pain, no abdominal pain, still reports some cough, but this significantly improved, no further dyspnea.    Objective:   Blood pressure (!) 141/91, pulse 78, temperature 97.9 F (36.6 C), temperature source Oral, resp. rate 19, height 5' 4" (1.626 m), weight 90.7 kg, SpO2 94 %.   Intake/Output Summary (Last 24 hours) at 07/21/2020 1105 Last data filed at 07/20/2020 1700 Gross per 24 hour  Intake 480 ml  Output --  Net 480 ml    Exam Awake Alert, Oriented x 3, No new F.N deficits, Normal affect  Symmetrical Chest wall movement, Good air movement bilaterally, CTAB RRR,No Gallops,Rubs or new Murmurs, No Parasternal Heave +ve B.Sounds, Abd Soft, Non tender,  No rebound -guarding or rigidity. No Cyanosis, Clubbing or edema, No new Rash or bruise  Data Review   CBC w Diff:  Lab Results  Component Value Date   WBC 9.3 07/19/2020   HGB 12.6 07/19/2020   HCT 37.4 07/19/2020   PLT 258 07/19/2020   LYMPHOPCT 7 07/17/2020   MONOPCT 9 07/17/2020   EOSPCT 0 07/17/2020    BASOPCT 0 07/17/2020    CMP:  Lab Results  Component Value Date   NA 132 (L) 07/19/2020   K 4.0 07/19/2020   CL 98 07/19/2020   CO2 23 07/19/2020   BUN 10 07/19/2020   CREATININE 0.65 07/19/2020   PROT 6.1 (L) 07/17/2020   ALBUMIN 2.6 (L) 07/17/2020   BILITOT 1.1 07/17/2020   ALKPHOS 56 07/17/2020   AST 42 (H) 07/17/2020   ALT 54 (H) 07/17/2020  .   Total Time in preparing paper work, data evaluation and todays exam - 49 minutes  Phillips Climes M.D on 07/21/2020 at 11:05 AM  [facility]   Office  [phone]

## 2020-07-21 NOTE — Discharge Instructions (Signed)
Follow with Primary MD\ in 7 days   Get CBC, CMP, 2 view Chest X ray checked  by Primary MD next visit.    Activity: As tolerated with Full fall precautions use walker/cane & assistance as needed   Disposition Home    Diet: Heart Healthy  .   On your next visit with your primary care physician please Get Medicines reviewed and adjusted.   Please request your Prim.MD to go over all Hospital Tests and Procedure/Radiological results at the follow up, please get all Hospital records sent to your Prim MD by signing hospital release before you go home.   If you experience worsening of your admission symptoms, develop shortness of breath, life threatening emergency, suicidal or homicidal thoughts you must seek medical attention immediately by calling 911 or calling your MD immediately  if symptoms less severe.  You Must read complete instructions/literature along with all the possible adverse reactions/side effects for all the Medicines you take and that have been prescribed to you. Take any new Medicines after you have completely understood and accpet all the possible adverse reactions/side effects.   Do not drive, operating heavy machinery, perform activities at heights, swimming or participation in water activities or provide baby sitting services if your were admitted for syncope or siezures until you have seen by Primary MD or a Neurologist and advised to do so again.  Do not drive when taking Pain medications.    Do not take more than prescribed Pain, Sleep and Anxiety Medications  Special Instructions: If you have smoked or chewed Tobacco  in the last 2 yrs please stop smoking, stop any regular Alcohol  and or any Recreational drug use.  Wear Seat belts while driving.   Please note  You were cared for by a hospitalist during your hospital stay. If you have any questions about your discharge medications or the care you received while you were in the hospital after you are  discharged, you can call the unit and asked to speak with the hospitalist on call if the hospitalist that took care of you is not available. Once you are discharged, your primary care physician will handle any further medical issues. Please note that NO REFILLS for any discharge medications will be authorized once you are discharged, as it is imperative that you return to your primary care physician (or establish a relationship with a primary care physician if you do not have one) for your aftercare needs so that they can reassess your need for medications and monitor your lab values.

## 2020-07-21 NOTE — Progress Notes (Signed)
Completed discharge teaching. Patient verbalized understanding of medication regimen and the need to follow up with a primary care doc for xray and labs.  PIV removed and patient belongings gathered.  Patient taken to discharge area where ride was waiting via wheelchair by NT.

## 2020-07-22 ENCOUNTER — Encounter (HOSPITAL_COMMUNITY): Payer: Self-pay | Admitting: Pharmacist

## 2020-07-22 LAB — CULTURE, BLOOD (ROUTINE X 2)
Culture: NO GROWTH
Culture: NO GROWTH

## 2020-08-08 LAB — LEGIONELLA PNEUMOPHILA SEROGP 1 UR AG: L. pneumophila Serogp 1 Ur Ag: POSITIVE — AB

## 2020-09-09 ENCOUNTER — Emergency Department (HOSPITAL_COMMUNITY)
Admission: EM | Admit: 2020-09-09 | Discharge: 2020-09-09 | Disposition: A | Discharge status: Home / Self Care | Payer: Medicaid - Out of State | Attending: Emergency Medicine | Admitting: Emergency Medicine

## 2020-09-09 ENCOUNTER — Encounter (HOSPITAL_COMMUNITY): Payer: Self-pay

## 2020-09-09 ENCOUNTER — Emergency Department (HOSPITAL_COMMUNITY): Payer: Medicaid - Out of State

## 2020-09-09 ENCOUNTER — Other Ambulatory Visit: Payer: Self-pay

## 2020-09-09 DIAGNOSIS — F332 Major depressive disorder, recurrent severe without psychotic features: Secondary | ICD-10-CM | POA: Diagnosis present

## 2020-09-09 DIAGNOSIS — Z20822 Contact with and (suspected) exposure to covid-19: Secondary | ICD-10-CM | POA: Insufficient documentation

## 2020-09-09 DIAGNOSIS — F1094 Alcohol use, unspecified with alcohol-induced mood disorder: Secondary | ICD-10-CM

## 2020-09-09 DIAGNOSIS — F32A Depression, unspecified: Secondary | ICD-10-CM

## 2020-09-09 DIAGNOSIS — R112 Nausea with vomiting, unspecified: Secondary | ICD-10-CM

## 2020-09-09 DIAGNOSIS — R000 Tachycardia, unspecified: Secondary | ICD-10-CM | POA: Diagnosis not present

## 2020-09-09 DIAGNOSIS — Z853 Personal history of malignant neoplasm of breast: Secondary | ICD-10-CM | POA: Diagnosis not present

## 2020-09-09 DIAGNOSIS — Y905 Blood alcohol level of 100-119 mg/100 ml: Secondary | ICD-10-CM | POA: Insufficient documentation

## 2020-09-09 DIAGNOSIS — F10920 Alcohol use, unspecified with intoxication, uncomplicated: Secondary | ICD-10-CM

## 2020-09-09 DIAGNOSIS — G4700 Insomnia, unspecified: Secondary | ICD-10-CM

## 2020-09-09 DIAGNOSIS — F1099 Alcohol use, unspecified with unspecified alcohol-induced disorder: Secondary | ICD-10-CM | POA: Diagnosis not present

## 2020-09-09 LAB — COMPREHENSIVE METABOLIC PANEL
ALT: 37 U/L (ref 0–44)
AST: 48 U/L — ABNORMAL HIGH (ref 15–41)
Albumin: 4.4 g/dL (ref 3.5–5.0)
Alkaline Phosphatase: 78 U/L (ref 38–126)
Anion gap: 22 — ABNORMAL HIGH (ref 5–15)
BUN: 6 mg/dL (ref 6–20)
CO2: 21 mmol/L — ABNORMAL LOW (ref 22–32)
Calcium: 8.9 mg/dL (ref 8.9–10.3)
Chloride: 93 mmol/L — ABNORMAL LOW (ref 98–111)
Creatinine, Ser: 0.78 mg/dL (ref 0.44–1.00)
GFR, Estimated: >60 mL/min (ref >60)
Glucose, Bld: 103 mg/dL — ABNORMAL HIGH (ref 70–99)
Potassium: 3.7 mmol/L (ref 3.5–5.1)
Sodium: 136 mmol/L (ref 135–145)
Total Bilirubin: 1.2 mg/dL (ref 0.3–1.2)
Total Protein: 7.9 g/dL (ref 6.5–8.1)

## 2020-09-09 LAB — I-STAT BETA HCG BLOOD, ED (MC, WL, AP ONLY): I-stat hCG, quantitative: <5 m[IU]/mL (ref <5)

## 2020-09-09 LAB — RAPID URINE DRUG SCREEN, HOSP PERFORMED
Amphetamines: NOT DETECTED
Barbiturates: NOT DETECTED
Benzodiazepines: NOT DETECTED
Cocaine: NOT DETECTED
Opiates: NOT DETECTED
Tetrahydrocannabinol: NOT DETECTED

## 2020-09-09 LAB — RESP PANEL BY RT-PCR (FLU A&B, COVID) ARPGX2
Influenza A by PCR: NEGATIVE
Influenza B by PCR: NEGATIVE
SARS Coronavirus 2 by RT PCR: NEGATIVE

## 2020-09-09 LAB — CBC WITH DIFFERENTIAL/PLATELET
Abs Immature Granulocytes: 0.01 10*3/uL (ref 0.00–0.07)
Basophils Absolute: 0.1 10*3/uL (ref 0.0–0.1)
Basophils Relative: 1 %
Eosinophils Absolute: 0 10*3/uL (ref 0.0–0.5)
Eosinophils Relative: 1 %
HCT: 44.2 % (ref 36.0–46.0)
Hemoglobin: 15.2 g/dL — ABNORMAL HIGH (ref 12.0–15.0)
Immature Granulocytes: 0 %
Lymphocytes Relative: 36 %
Lymphs Abs: 2.1 10*3/uL (ref 0.7–4.0)
MCH: 30 pg (ref 26.0–34.0)
MCHC: 34.4 g/dL (ref 30.0–36.0)
MCV: 87.2 fL (ref 80.0–100.0)
Monocytes Absolute: 0.6 10*3/uL (ref 0.1–1.0)
Monocytes Relative: 10 %
Neutro Abs: 3.1 10*3/uL (ref 1.7–7.7)
Neutrophils Relative %: 52 %
Platelets: 266 10*3/uL (ref 150–400)
RBC: 5.07 MIL/uL (ref 3.87–5.11)
RDW: 15.8 % — ABNORMAL HIGH (ref 11.5–15.5)
WBC: 5.9 10*3/uL (ref 4.0–10.5)
nRBC: 0 % (ref 0.0–0.2)

## 2020-09-09 LAB — ETHANOL: Alcohol, Ethyl (B): 165 mg/dL — ABNORMAL HIGH (ref <10)

## 2020-09-09 LAB — MAGNESIUM: Magnesium: 2.2 mg/dL (ref 1.7–2.4)

## 2020-09-09 LAB — TSH: TSH: 2.539 u[IU]/mL (ref 0.350–4.500)

## 2020-09-09 MED ORDER — LORAZEPAM 1 MG PO TABS: 0.0000 mg | ORAL_TABLET | Freq: Four times a day (QID) | ORAL | Status: DC

## 2020-09-09 MED ORDER — KETOROLAC TROMETHAMINE 30 MG/ML IJ SOLN
15.0000 mg | Freq: Once | INTRAMUSCULAR | Status: AC
  Administered 2020-09-09: 15 mg via INTRAVENOUS
  Filled 2020-09-09: qty 1

## 2020-09-09 MED ORDER — LORAZEPAM 2 MG/ML IJ SOLN: 0.0000 mg | Freq: Two times a day (BID) | INTRAMUSCULAR | Status: DC

## 2020-09-09 MED ORDER — THIAMINE HCL 100 MG PO TABS: 100.0000 mg | ORAL_TABLET | Freq: Every day | ORAL | Status: DC

## 2020-09-09 MED ORDER — PROCHLORPERAZINE EDISYLATE 10 MG/2ML IJ SOLN
10.0000 mg | Freq: Once | INTRAMUSCULAR | Status: AC
  Administered 2020-09-09: 10 mg via INTRAVENOUS
  Filled 2020-09-09: qty 2

## 2020-09-09 MED ORDER — THIAMINE HCL 100 MG/ML IJ SOLN
100.0000 mg | Freq: Every day | INTRAMUSCULAR | Status: DC
  Administered 2020-09-09: 100 mg via INTRAVENOUS
  Filled 2020-09-09: qty 2

## 2020-09-09 MED ORDER — ONDANSETRON HCL 4 MG/2ML IJ SOLN
4.0000 mg | Freq: Once | INTRAMUSCULAR | Status: AC
  Administered 2020-09-09: 4 mg via INTRAVENOUS
  Filled 2020-09-09: qty 2

## 2020-09-09 MED ORDER — LORAZEPAM 2 MG/ML IJ SOLN
1.0000 mg | Freq: Once | INTRAMUSCULAR | Status: AC
  Administered 2020-09-09: 1 mg via INTRAVENOUS
  Filled 2020-09-09: qty 1

## 2020-09-09 MED ORDER — LACTATED RINGERS IV BOLUS
1000.0000 mL | Freq: Once | INTRAVENOUS | Status: AC
  Administered 2020-09-09: 1000 mL via INTRAVENOUS

## 2020-09-09 MED ORDER — LORAZEPAM 1 MG PO TABS
0.0000 mg | ORAL_TABLET | Freq: Two times a day (BID) | ORAL | Status: DC
Start: 2020-09-11 — End: 2020-09-10

## 2020-09-09 MED ORDER — LORAZEPAM 2 MG/ML IJ SOLN
0.0000 mg | Freq: Four times a day (QID) | INTRAMUSCULAR | Status: DC
  Administered 2020-09-09: 1 mg via INTRAVENOUS
  Filled 2020-09-09: qty 1

## 2020-09-09 NOTE — Discharge Instructions (Addendum)
It was our pleasure to provide your ER care today - we hope that you feel better.  Follow up with the behavioral health team as discussed with them - see resource guide provided.  For behavioral health issues and/or crisis, you may go directly to the [facility] - it is open 24/7 and walk-ins are welcome.   Avoid alcohol use, and make sure to never drink and drive.  Drink plenty of fluids/stay well hydrated. Eat balanced diet.   Your blood pressure is high - limit salt intake, follow heart healthy eating plan, and follow up with primary care doctor for recheck this coming week.   To help with sleep, avoid caffeine use. You may try unisom or zzzquil as need.   Return to ER if worse, new symptoms, fevers, new or severe pain, chest pain, trouble breathing, or other concern.

## 2020-09-09 NOTE — ED Notes (Signed)
Pt placed in private room for TTS 

## 2020-09-09 NOTE — ED Provider Notes (Addendum)
Signed out by Dr Alvino Chapel at 1600 that cxr and bh consult are pending - he feels likely that patient can be d/c'd then.   [facility] team has reassessed and indicates pt is psych clear for d/c, they recommend outpatient follow up at [facility]:    Recheck, pt alert, content, and appears in no acute distress.   Pt denies daily and/or heavy etoh use, denies hx etoh withdrawal problems, dts or seizures.   Pt currently resting comfortably, hr 88, rr 16, no distress.   Discussed BH eval w pt - she indicates is agreeable to outpatient f/u plan.   Rec outpt BH and pcp f/u.   Return precautions provided.        Lajean Saver, MD 09/09/20 1907

## 2020-09-09 NOTE — ED Provider Notes (Signed)
[facility] EMERGENCY DEPARTMENT Provider Note   CSN: [id number] Arrival date & time: 09/09/20  0855     History Chief Complaint  Patient presents with  . Insomnia    Sandra Farmer is a 55 y.o. female.  HPI Patient presents with nausea and vomiting.  Also feeling anxious.  States she is not been able to sleep for a while.  States insomnia.  States she gets this way when she gets depressed.  Also gets depressed when she gets this way.  States she feels as if her potassium is low.  States that she is very depressed but not suicidal.  States she is also been throwing up.  States she has not been taking her medications.  Unknown how long she has been off of them.  States she is not sure because she sleeps sometimes he does not know she takes her medicines because she is sleeping.Denies drug use.    Past Medical History:  Diagnosis Date  . Anxiety   . Arthritis   . Blood transfusion without reported diagnosis   . Breast cancer ([place]) 2018  . Depression   . Pulmonary emboli ([place])   . Sepsis ([place])   . Sleep apnea     Patient Active Problem List   Diagnosis Date Noted  . Sepsis ([place]) 07/18/2020  . Hypokalemia 07/18/2020  . Elevated troponin 07/18/2020  . Elevated brain natriuretic peptide (BNP) level 07/18/2020  . Multifocal pneumonia 07/17/2020    Past Surgical History:  Procedure Laterality Date  . GASTRIC BYPASS    . MASTECTOMY Bilateral      OB History   No obstetric history on file.     History reviewed. No pertinent family history.  Social History   Tobacco Use  . Smoking status: Never Smoker  . Smokeless tobacco: Never Used  Vaping Use  . Vaping Use: Never used  Substance Use Topics  . Alcohol use: Never  . Drug use: Never    Home Medications Prior to Admission medications   Medication Sig Start Date End Date Taking? Authorizing Provider  buPROPion (WELLBUTRIN) 100 MG tablet Take 100 mg by mouth daily.   Yes [provider]  Cyanocobalamin (VITAMIN B 12 PO) Take 1 tablet by mouth daily.   Yes [provider]  FERROUS SULFATE PO Take 1 tablet by mouth daily as needed (iron supplement).   Yes [provider]  Multiple Vitamins-Minerals (ONE-A-DAY WOMENS 50+) TABS Take 1 tablet by mouth daily.   Yes [provider]  Potassium Gluconate 550 (90 K) MG TABS Take 1 tablet by mouth daily.   Yes [provider]    Allergies    Nsaids and Other  Review of Systems   Review of Systems  Constitutional: Positive for appetite change.  HENT: Negative for congestion.   Respiratory: Negative for shortness of breath.   Cardiovascular: Negative for chest pain.  Gastrointestinal: Positive for nausea and vomiting. Negative for abdominal pain.  Genitourinary: Negative for flank pain.  Musculoskeletal: Negative for back pain.  Skin: Negative for rash.  Neurological: Negative for weakness.  Psychiatric/Behavioral: Positive for dysphoric mood. Negative for suicidal ideas. The patient is nervous/anxious.     Physical Exam Updated Vital Signs BP (!) 169/99   Pulse (!) 111   Temp 97.8 F (36.6 C) (Oral)   Resp 15   SpO2 93%   Physical Exam Vitals and nursing note reviewed.  HENT:     Head: Normocephalic.  Right Ear: External ear normal.     Left Ear: External ear normal.     Mouth/Throat:     Mouth: Mucous membranes are moist.  Eyes:     Extraocular Movements: Extraocular movements intact.  Cardiovascular:     Rate and Rhythm: Tachycardia present.  Pulmonary:     Breath sounds: No wheezing or rhonchi.  Abdominal:     Tenderness: There is no abdominal tenderness.  Musculoskeletal:        General: No tenderness.     Cervical back: Neck supple.  Skin:    General: Skin is warm.     Capillary Refill: Capillary refill takes less than 2 seconds.  Neurological:     Mental Status: She is alert and oriented to person, place, and time.  Psychiatric:     Comments:  Appears somewhat anxious     ED Results / Procedures / Treatments   Labs (all labs ordered are listed, but only abnormal results are displayed) Labs Reviewed  COMPREHENSIVE METABOLIC PANEL - Abnormal; Notable for the following components:      Result Value   Chloride 93 (*)    CO2 21 (*)    Glucose, Bld 103 (*)    AST 48 (*)    Anion gap 22 (*)    All other components within normal limits  ETHANOL - Abnormal; Notable for the following components:   Alcohol, Ethyl (B) 165 (*)    All other components within normal limits  CBC WITH DIFFERENTIAL/PLATELET - Abnormal; Notable for the following components:   Hemoglobin 15.2 (*)    RDW 15.8 (*)    All other components within normal limits  RESP PANEL BY RT-PCR (FLU A&B, COVID) ARPGX2  RAPID URINE DRUG SCREEN, HOSP PERFORMED  TSH  MAGNESIUM  I-STAT BETA HCG BLOOD, ED ([facility], [facility], [facility] ONLY)    EKG None  Radiology No results found.  Procedures Procedures   Medications Ordered in ED Medications  LORazepam (ATIVAN) injection 0-4 mg (1 mg Intravenous Given 09/09/20 1512)    Or  LORazepam (ATIVAN) tablet 0-4 mg ( Oral See Alternative 09/09/20 1512)  LORazepam (ATIVAN) injection 0-4 mg (has no administration in time range)    Or  LORazepam (ATIVAN) tablet 0-4 mg (has no administration in time range)  thiamine tablet 100 mg ( Oral See Alternative 09/09/20 1513)    Or  thiamine (B-1) injection 100 mg (100 mg Intravenous Given 09/09/20 1513)  ondansetron (ZOFRAN) injection 4 mg (4 mg Intravenous Given 09/09/20 0950)  LORazepam (ATIVAN) injection 1 mg (1 mg Intravenous Given 09/09/20 0950)  lactated ringers bolus 1,000 mL (0 mLs Intravenous Stopped 09/09/20 1354)  prochlorperazine (COMPAZINE) injection 10 mg (10 mg Intravenous Given 09/09/20 1348)  ketorolac (TORADOL) 30 MG/ML injection 15 mg (15 mg Intravenous Given 09/09/20 1348)  lactated ringers bolus 1,000 mL (1,000 mLs Intravenous New Bag/Given 09/09/20 1348)    ED Course  I have  reviewed the triage vital signs and the nursing notes.  Pertinent labs & imaging results that were available during my care of the patient were reviewed by me and considered in my medical decision making (see chart for details).    MDM Rules/Calculators/A&P                          Patient presents taking her potassium is low.  Has had nausea vomiting.  Decreased oral intake.  Back pain.  No cough.  Lab work overall reassuring although hemoglobin  is increased to 15 from recent baseline of 12.  Likely dehydration component.  Potassium actually reassuring.  However alcohol level is up.  Normal TSH.  Patient states she is depressed.  States she is not suicidal but otherwise very depressed.  Unable to manage her normal activities.  States she stopped taking her medicine because of this.  Patient appears to be medically cleared.  We will have patient seen by TTA Final Clinical Impression(s) / ED Diagnoses Final diagnoses:  Insomnia, unspecified type  Depression, unspecified depression type  Nausea and vomiting, intractability of vomiting not specified, unspecified vomiting type  Alcohol use    Rx / DC Orders ED Discharge Orders    None       Davonna Belling, MD 09/09/20 1533
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
## 2020-09-09 NOTE — ED Notes (Signed)
Pt ambulated to the bathroom with no issue.

## 2020-09-09 NOTE — ED Triage Notes (Addendum)
Pt came in POV for c/o insomnia and low potassium. Pt reports sleeping for the last 3 days and insomnia since last night. Pt states this is a recurrent issue and denies taking any medicine for insomnia. Last BM 3 days ago. Pt is supposed to take Potassium since haviong her gastric sx but has not been compliant. Pt very anxious during triage. Pt takes Wellbutrin.

## 2020-09-09 NOTE — ED Notes (Signed)
Pt given meal and refreshments.

## 2020-09-21 ENCOUNTER — Emergency Department (HOSPITAL_COMMUNITY)
Admission: EM | Admit: 2020-09-21 | Discharge: 2020-09-21 | Disposition: A | Discharge status: Home / Self Care | Payer: Medicaid - Out of State | Attending: Emergency Medicine | Admitting: Emergency Medicine

## 2020-09-21 ENCOUNTER — Encounter (HOSPITAL_COMMUNITY): Payer: Self-pay | Admitting: Emergency Medicine

## 2020-09-21 ENCOUNTER — Other Ambulatory Visit: Payer: Self-pay

## 2020-09-21 DIAGNOSIS — R000 Tachycardia, unspecified: Secondary | ICD-10-CM | POA: Diagnosis not present

## 2020-09-21 DIAGNOSIS — Y908 Blood alcohol level of 240 mg/100 ml or more: Secondary | ICD-10-CM | POA: Diagnosis not present

## 2020-09-21 DIAGNOSIS — F10920 Alcohol use, unspecified with intoxication, uncomplicated: Secondary | ICD-10-CM

## 2020-09-21 DIAGNOSIS — Z20822 Contact with and (suspected) exposure to covid-19: Secondary | ICD-10-CM | POA: Insufficient documentation

## 2020-09-21 DIAGNOSIS — G4700 Insomnia, unspecified: Secondary | ICD-10-CM | POA: Diagnosis not present

## 2020-09-21 DIAGNOSIS — F10220 Alcohol dependence with intoxication, uncomplicated: Secondary | ICD-10-CM | POA: Diagnosis present

## 2020-09-21 DIAGNOSIS — F22 Delusional disorders: Secondary | ICD-10-CM | POA: Insufficient documentation

## 2020-09-21 DIAGNOSIS — Z853 Personal history of malignant neoplasm of breast: Secondary | ICD-10-CM | POA: Insufficient documentation

## 2020-09-21 LAB — CBC WITH DIFFERENTIAL/PLATELET
Abs Immature Granulocytes: 0.01 10*3/uL (ref 0.00–0.07)
Basophils Absolute: 0.1 10*3/uL (ref 0.0–0.1)
Basophils Relative: 2 %
Eosinophils Absolute: 0 10*3/uL (ref 0.0–0.5)
Eosinophils Relative: 1 %
HCT: 41.4 % (ref 36.0–46.0)
Hemoglobin: 14.4 g/dL (ref 12.0–15.0)
Immature Granulocytes: 0 %
Lymphocytes Relative: 52 %
Lymphs Abs: 1.7 10*3/uL (ref 0.7–4.0)
MCH: 30.6 pg (ref 26.0–34.0)
MCHC: 34.8 g/dL (ref 30.0–36.0)
MCV: 88.1 fL (ref 80.0–100.0)
Monocytes Absolute: 0.3 10*3/uL (ref 0.1–1.0)
Monocytes Relative: 10 %
Neutro Abs: 1.1 10*3/uL — ABNORMAL LOW (ref 1.7–7.7)
Neutrophils Relative %: 35 %
Platelets: 224 10*3/uL (ref 150–400)
RBC: 4.7 MIL/uL (ref 3.87–5.11)
RDW: 15.5 % (ref 11.5–15.5)
WBC: 3.3 10*3/uL — ABNORMAL LOW (ref 4.0–10.5)
nRBC: 0 % (ref 0.0–0.2)

## 2020-09-21 LAB — RAPID URINE DRUG SCREEN, HOSP PERFORMED
Amphetamines: NOT DETECTED
Barbiturates: NOT DETECTED
Benzodiazepines: NOT DETECTED
Cocaine: NOT DETECTED
Opiates: NOT DETECTED
Tetrahydrocannabinol: NOT DETECTED

## 2020-09-21 LAB — COMPREHENSIVE METABOLIC PANEL
ALT: 84 U/L — ABNORMAL HIGH (ref 0–44)
AST: 100 U/L — ABNORMAL HIGH (ref 15–41)
Albumin: 4 g/dL (ref 3.5–5.0)
Alkaline Phosphatase: 85 U/L (ref 38–126)
Anion gap: 15 (ref 5–15)
BUN: 6 mg/dL (ref 6–20)
CO2: 21 mmol/L — ABNORMAL LOW (ref 22–32)
Calcium: 8.5 mg/dL — ABNORMAL LOW (ref 8.9–10.3)
Chloride: 104 mmol/L (ref 98–111)
Creatinine, Ser: 0.73 mg/dL (ref 0.44–1.00)
GFR, Estimated: >60 mL/min (ref >60)
Glucose, Bld: 93 mg/dL (ref 70–99)
Potassium: 3.7 mmol/L (ref 3.5–5.1)
Sodium: 140 mmol/L (ref 135–145)
Total Bilirubin: 0.9 mg/dL (ref 0.3–1.2)
Total Protein: 7.2 g/dL (ref 6.5–8.1)

## 2020-09-21 LAB — RESP PANEL BY RT-PCR (FLU A&B, COVID) ARPGX2
Influenza A by PCR: NEGATIVE
Influenza B by PCR: NEGATIVE
SARS Coronavirus 2 by RT PCR: NEGATIVE

## 2020-09-21 LAB — ETHANOL: Alcohol, Ethyl (B): 340 mg/dL (ref <10)

## 2020-09-21 LAB — ACETAMINOPHEN LEVEL: Acetaminophen (Tylenol), Serum: <10 ug/mL — ABNORMAL LOW (ref 10–30)

## 2020-09-21 LAB — SALICYLATE LEVEL: Salicylate Lvl: <7 mg/dL — ABNORMAL LOW (ref 7.0–30.0)

## 2020-09-21 MED ORDER — ADULT MULTIVITAMIN W/MINERALS CH: 1.0000 | ORAL_TABLET | Freq: Every day | ORAL | Status: DC

## 2020-09-21 MED ORDER — ONDANSETRON HCL 4 MG PO TABS: 4.0000 mg | ORAL_TABLET | Freq: Three times a day (TID) | ORAL | Status: DC | PRN

## 2020-09-21 MED ORDER — HYDROXYZINE HCL 25 MG PO TABS: 25.0000 mg | ORAL_TABLET | Freq: Three times a day (TID) | ORAL | Status: DC | PRN

## 2020-09-21 MED ORDER — ALUM & MAG HYDROXIDE-SIMETH 200-200-20 MG/5ML PO SUSP: 30.0000 mL | Freq: Four times a day (QID) | ORAL | Status: DC | PRN

## 2020-09-21 MED ORDER — LORAZEPAM 1 MG PO TABS
1.0000 mg | ORAL_TABLET | Freq: Once | ORAL | Status: AC
  Administered 2020-09-21: 1 mg via ORAL
  Filled 2020-09-21: qty 1

## 2020-09-21 MED ORDER — ACETAMINOPHEN 325 MG PO TABS: 650.0000 mg | ORAL_TABLET | ORAL | Status: DC | PRN

## 2020-09-21 MED ORDER — BUPROPION HCL 100 MG PO TABS
100.0000 mg | ORAL_TABLET | Freq: Every day | ORAL | Status: DC
  Filled 2020-09-21: qty 1

## 2020-09-21 MED ORDER — ZOLPIDEM TARTRATE 5 MG PO TABS: 5.0000 mg | ORAL_TABLET | Freq: Every evening | ORAL | Status: DC | PRN

## 2020-09-21 NOTE — ED Notes (Signed)
Pt was able to ambulate wo feeling dizzy. Pt left without notifying staff.

## 2020-09-21 NOTE — ED Provider Notes (Signed)
  Physical Exam  BP 125/82 (BP Location: Right Arm)   Pulse (!) 112   Temp 98 F (36.7 C) (Oral)   Resp 20   SpO2 98%     MDM  55 year old female presents emergency department for insomnia and pain all over.  Relatively poor historian due to ethanol level above 300, clinically intoxicated.  Also does exhibit some paranoia and has an odd affect.  Was rocking back and forth in the bed. Did deny HI, SI.   Results with: COVID-negative AST 100, ALT 84.  Mild non-anion gap metabolic acidosis.  UDS negative.  Alcohol 340.  Mild leukocytosis present.  No elevation of salicylate level or acetaminophen level.  Plan at time of handoff is to continue to reassess for sobriety while closely monitoring.  Will need repeat evaluation for possible psychiatric condition as well, especially if paranoia does not improve with sobriety.  Upon reevaluation at 8:40 AM, has no slurred speech.  Is no longer exhibiting significant delay for answering questions.  Is still rocking in bed.  States she was able to sleep for 1 hour and feels little better.  Is requesting more Ativan because she has not slept in 4 days.  Discussed with her that we would like to try did not benzodiazepines as we do not think is will help her long-term picture.  Was a little tachycardic, but appears somewhat anxious and still is rocking back and forth in bed.  She does seem clinically sober at this time, she is not exhibiting paranoid behavior at this time.  Now that she is clinically sober, presentation not consistent with manic behavior or other psychiatric emergency.  She does deny history of significant alcohol withdrawal or seizure.  Diet ordered for comfort, she is hungry.  Still endorsing anxiety and difficulty sleeping, TTS consult placed.  No suicidal or homicidal ideation.  Patient eloped while awaiting TTS consult.  Behavioral health hold was not initiated as she did not present a danger to herself or others.  Denying suicidal homicidal  ideation.  Was sober and able to ambulate with steady gait.  She is look for in the hallways in the bathrooms, was not able to be found.    Suzan Nailer, DO 09/21/20 [place], Gwenyth Allegra, MD 09/22/20 [phone]

## 2020-09-21 NOTE — ED Notes (Signed)
Pt ambulated to bathroom 

## 2020-09-21 NOTE — ED Provider Notes (Signed)
[facility] EMERGENCY DEPARTMENT Provider Note   CSN: [id number] Arrival date & time: 09/21/20  0411     History Chief Complaint  Patient presents with  . Insomnia  . Alcohol Intoxication    Sandra Farmer is a 55 y.o. female with a history of depression, gastric bypass who presents the emergency department via EMS with a chief complaint of insomnia.  The patient states that she has not slept in some time, but is unsure how long it has been since she has slept.  She states that insomnia is worse when she is feeling more depressed.  She is unsure how long she has been feeling more depressed, but states that she moved to [place] 2 months ago to live with her cousin.  She states "she seemed nice on the phone, but she fooled me."  States that she is allowed to go into the backyard with her dogs.  She previously lived in [place] and moved here " [place]".   She also endorses drinking alcohol today.  She denies any illicit or recreational substance use.  She is unsure of how much alcohol she consumes.  She states that she drinks daily.  She is also endorsing all-over body pain.  She is unsure how long the pain has been present.  History is difficult to follow as patient is rocking back and forth and exhibiting erratic behavior.  In triage, the patient endorsed that she was unsure when she last slept because she "faints".  She denies this to me.  Per chart review, patient had gastric bypass surgery in January 2022, per discharge summary from the hospital admission in March 2022.  She denies SI, HI, or auditory visual hallucinations.  Level V caveat secondary to intoxication.   The history is provided by the patient and medical records. No language interpreter was used.       Past Medical History:  Diagnosis Date  . Anxiety   . Arthritis   . Blood transfusion without reported diagnosis   . Breast cancer ([place]) 2018  . Depression   .  Pulmonary emboli ([place])   . Sepsis ([place])   . Sleep apnea     Patient Active Problem List   Diagnosis Date Noted  . Severe episode of recurrent major depressive disorder, without psychotic features ([place])   . Sepsis ([place]) 07/18/2020  . Hypokalemia 07/18/2020  . Elevated troponin 07/18/2020  . Elevated brain natriuretic peptide (BNP) level 07/18/2020  . Multifocal pneumonia 07/17/2020    Past Surgical History:  Procedure Laterality Date  . GASTRIC BYPASS    . MASTECTOMY Bilateral      OB History   No obstetric history on file.     No family history on file.  Social History   Tobacco Use  . Smoking status: Never Smoker  . Smokeless tobacco: Never Used  Vaping Use  . Vaping Use: Never used  Substance Use Topics  . Alcohol use: Never  . Drug use: Never    Home Medications Prior to Admission medications   Medication Sig Start Date End Date Taking? Authorizing Provider  acetaminophen (TYLENOL) 500 MG tablet Take 1,000 mg by mouth every 6 (six) hours as needed for moderate pain or headache.   Yes [provider]  buPROPion (WELLBUTRIN) 100 MG tablet Take 100 mg by mouth daily.   Yes [provider]  Cyanocobalamin (VITAMIN B 12 PO) Take 1 tablet by mouth daily.   Yes  [provider]  FERROUS SULFATE PO Take 1 tablet by mouth daily.   Yes [provider]  Multiple Vitamins-Minerals (ONE-A-DAY WOMENS 50+) TABS Take 1 tablet by mouth daily.   Yes [provider]  Psyllium (METAMUCIL) 48.57 % POWD Take 1 Dose by mouth daily as needed (constipation).   Yes [provider]    Allergies    Nsaids and Other  Review of Systems   Review of Systems  Unable to perform ROS: Mental status change    Physical Exam Updated Vital Signs BP 118/74 (BP Location: Right Arm)   Pulse 68   Temp 98.7 F (37.1 C) (Oral)   Resp 20   SpO2 99%   Physical Exam Vitals and nursing note reviewed.  Constitutional:      Comments: Rocking  back and forth on the bed Patient will answer questions appropriately and without slurred speech, but answers are delayed.  HENT:     Mouth/Throat:     Mouth: Mucous membranes are moist.     Pharynx: No oropharyngeal exudate.  Eyes:     Extraocular Movements: Extraocular movements intact.     Pupils: Pupils are equal, round, and reactive to light.  Cardiovascular:     Rate and Rhythm: Tachycardia present.  Pulmonary:     Effort: No respiratory distress.     Breath sounds: No stridor. No wheezing, rhonchi or rales.     Comments: Lungs are clear to auscultation bilaterally. Chest:     Chest wall: No tenderness.  Abdominal:     Tenderness: There is no abdominal tenderness.     Comments: Abdomen is soft and without focal tenderness.  No rebound or guarding.  Skin:    Coloration: Skin is not jaundiced.  Neurological:     Sensory: Sensory deficit:      Comments: Alert and oriented x3.  Psychiatric:        Attention and Perception: She does not perceive auditory or visual hallucinations.        Mood and Affect: Mood is anxious. Mood is not depressed. Affect is not labile, blunt, flat, angry, tearful or inappropriate.        Speech: Speech is delayed.        Behavior: Behavior is not agitated, aggressive or hyperactive.        Thought Content: Thought content is paranoid. Thought content does not include homicidal or suicidal ideation. Thought content does not include homicidal or suicidal plan.     Comments: Repetitive responses to questions    ED Results / Procedures / Treatments   Labs (all labs ordered are listed, but only abnormal results are displayed) Labs Reviewed  COMPREHENSIVE METABOLIC PANEL - Abnormal; Notable for the following components:      Result Value   CO2 21 (*)    Calcium 8.5 (*)    AST 100 (*)    ALT 84 (*)    All other components within normal limits  ETHANOL - Abnormal; Notable for the following components:   Alcohol, Ethyl (B) 340 (*)    All other  components within normal limits  CBC WITH DIFFERENTIAL/PLATELET - Abnormal; Notable for the following components:   WBC 3.3 (*)    Neutro Abs 1.1 (*)    All other components within normal limits  ACETAMINOPHEN LEVEL - Abnormal; Notable for the following components:   Acetaminophen (Tylenol), Serum <10 (*)    All other components within normal limits  SALICYLATE LEVEL - Abnormal; Notable for the following components:  Salicylate Lvl <2.4 (*)    All other components within normal limits  RESP PANEL BY RT-PCR (FLU A&B, COVID) ARPGX2  RAPID URINE DRUG SCREEN, HOSP PERFORMED    EKG EKG Interpretation  Date/Time:  Friday Sep 21 2020 04:52:29 EDT Ventricular Rate:  104 PR Interval:  148 QRS Duration: 99 QT Interval:  385 QTC Calculation: 507 R Axis:   -8 Text Interpretation: Sinus tachycardia Low voltage, precordial leads Abnormal inferior Q waves Consider anterior infarct When compared with ECG of 09/09/2020, Premature ventricular complexes are no longer present Confirmed by Delora Fuel ([id number]) on 09/21/2020 4:54:54 AM   Radiology No results found.  Procedures Procedures   Medications Ordered in ED Medications  LORazepam (ATIVAN) tablet 1 mg (1 mg Oral Given 09/21/20 0503)    ED Course  I have reviewed the triage vital signs and the nursing notes.  Pertinent labs & imaging results that were available during my care of the patient were reviewed by me and considered in my medical decision making (see chart for details).    MDM Rules/Calculators/A&P                          55 year old female with a history of depression, gastric bypass who presents the emergency department by EMS with a chief complaint of insomnia.  History is limited as patient is exhibiting erratic behavior.  She is alert and oriented x3, but is rocking back and forth on the bed with paranoia that seems fixated on her cousin.  Tachycardic in the 110s.  Afebrile.  Normotensive.  No hypoxia or tachypnea.  On  exam, she appears.  Paranoid.  She has no slurred speech.  She has delayed responses to questions and will repeat answers.  She denies SI, HI, or auditory visual hallucinations.  Labs and imaging have been reviewed and independently interpreted by me.  Ethanol level is elevated at 340.  UDS is negative.  Transaminases are elevated, likely secondary to chronic alcohol use.  She has a leukopenia of uncertain etiology.  COVID-19 test is pending.  Suspect symptoms are secondary to alcohol intoxication, but she may need to be further evaluated for acute psychotic break or major depressive episode after she is clinically sober.  Doubt sepsis, PE, hepatic encephalopathy.  Patient care transferred to Dr. Miki Kins, [place] resident, at the end of my shift to reevaluate the patient after she has metabolized ethanol and reassess.  She may benefit from TTS consult. Patient presentation, ED course, and plan of care discussed with review of all pertinent labs and imaging. Please see his/her note for further details regarding further ED course and disposition.   Final Clinical Impression(s) / ED Diagnoses Final diagnoses:  Acute alcoholic intoxication without complication [facility])    Rx / DC Orders ED Discharge Orders    None       Joanne Gavel, PA-C 72/[id number]/66 [id number]    Delora Fuel, MD 47/42/59 [phone]

## 2020-09-21 NOTE — ED Triage Notes (Signed)
Per EMS, pt called out for insomnia, would not allow EMS to take vitals and such.  Admitted to drinking all day.  Appears to have some family issues.    Pt is poor historian in triage, states she is hurting all over b/c of the insomnia, states she is unaware of when she last slept b/c she "faints."

## 2020-09-23 ENCOUNTER — Emergency Department (HOSPITAL_COMMUNITY): Payer: Medicaid - Out of State

## 2020-09-23 ENCOUNTER — Emergency Department (HOSPITAL_COMMUNITY)
Admission: EM | Admit: 2020-09-23 | Discharge: 2020-09-23 | Disposition: A | Discharge status: Home / Self Care | Payer: Medicaid - Out of State | Attending: Emergency Medicine | Admitting: Emergency Medicine

## 2020-09-23 DIAGNOSIS — Z853 Personal history of malignant neoplasm of breast: Secondary | ICD-10-CM | POA: Insufficient documentation

## 2020-09-23 DIAGNOSIS — Z23 Encounter for immunization: Secondary | ICD-10-CM | POA: Insufficient documentation

## 2020-09-23 DIAGNOSIS — S0990XA Unspecified injury of head, initial encounter: Secondary | ICD-10-CM | POA: Diagnosis present

## 2020-09-23 DIAGNOSIS — W109XXA Fall (on) (from) unspecified stairs and steps, initial encounter: Secondary | ICD-10-CM | POA: Diagnosis not present

## 2020-09-23 DIAGNOSIS — M542 Cervicalgia: Secondary | ICD-10-CM | POA: Diagnosis not present

## 2020-09-23 DIAGNOSIS — S0101XA Laceration without foreign body of scalp, initial encounter: Secondary | ICD-10-CM | POA: Diagnosis not present

## 2020-09-23 DIAGNOSIS — M545 Low back pain, unspecified: Secondary | ICD-10-CM | POA: Diagnosis not present

## 2020-09-23 DIAGNOSIS — M546 Pain in thoracic spine: Secondary | ICD-10-CM | POA: Insufficient documentation

## 2020-09-23 MED ORDER — ACETAMINOPHEN 325 MG PO TABS: 650.0000 mg | ORAL_TABLET | Freq: Once | ORAL | Status: DC

## 2020-09-23 MED ORDER — TETANUS-DIPHTH-ACELL PERTUSSIS 5-2.5-18.5 LF-MCG/0.5 IM SUSY
0.5000 mL | PREFILLED_SYRINGE | Freq: Once | INTRAMUSCULAR | Status: AC
  Administered 2020-09-23: 0.5 mL via INTRAMUSCULAR
  Filled 2020-09-23: qty 0.5

## 2020-09-23 NOTE — Discharge Instructions (Addendum)
Continue taking home medications as prescribed. Use Tylenol or ibuprofen as needed for pain. Use ice to help with pain and swelling. Return to the emergency room with any new, worsening, concerning symptoms.

## 2020-09-23 NOTE — ED Provider Notes (Signed)
[facility] DEPT Provider Note   CSN: [id number] Arrival date & time: 09/23/20  0345     History Chief Complaint  Patient presents with  . Fall  . Head Injury    Sandra Farmer is a 55 y.o. female presents to the Emergency Department complaining of acute, persistent headache and back pain onset several hours ago after fall.  Patient reports she has been drinking "a lot" and stumbled going down the stairs.  She reports she fell down 5 stairs and did strike her head.  Denies loss of consciousness, numbness, weakness, tingling, loss of bowel or bladder control.  Patient reports she has had pain all over and difficulty sleeping for the last several months.  Reports she is now a daily drinker.  No specific aggravating or alleviating factors.  Reports she can walk without difficulty at this time.  Is not anticoagulated.  The history is provided by the patient and medical records. No language interpreter was used.       Past Medical History:  Diagnosis Date  . Anxiety   . Arthritis   . Blood transfusion without reported diagnosis   . Breast cancer ([place]) 2018  . Depression   . Pulmonary emboli ([place])   . Sepsis ([place])   . Sleep apnea     Patient Active Problem List   Diagnosis Date Noted  . Severe episode of recurrent major depressive disorder, without psychotic features ([place])   . Sepsis ([place]) 07/18/2020  . Hypokalemia 07/18/2020  . Elevated troponin 07/18/2020  . Elevated brain natriuretic peptide (BNP) level 07/18/2020  . Multifocal pneumonia 07/17/2020    Past Surgical History:  Procedure Laterality Date  . GASTRIC BYPASS    . MASTECTOMY Bilateral      OB History   No obstetric history on file.     No family history on file.  Social History   Tobacco Use  . Smoking status: Never Smoker  . Smokeless tobacco: Never Used  Vaping Use  . Vaping Use: Never used  Substance Use Topics  . Alcohol use: Never  . Drug use: Never     Home Medications Prior to Admission medications   Medication Sig Start Date End Date Taking? Authorizing Provider  acetaminophen (TYLENOL) 500 MG tablet Take 1,000 mg by mouth every 6 (six) hours as needed for moderate pain or headache.    [provider]  buPROPion (WELLBUTRIN) 100 MG tablet Take 100 mg by mouth daily.    [provider]  Cyanocobalamin (VITAMIN B 12 PO) Take 1 tablet by mouth daily.    [provider]  FERROUS SULFATE PO Take 1 tablet by mouth daily.    [provider]  Multiple Vitamins-Minerals (ONE-A-DAY WOMENS 50+) TABS Take 1 tablet by mouth daily.    [provider]  Psyllium (METAMUCIL) 48.57 % POWD Take 1 Dose by mouth daily as needed (constipation).    [provider]    Allergies    Nsaids and Other  Review of Systems   Review of Systems  Constitutional: Negative for appetite change, diaphoresis, fatigue, fever and unexpected weight change.  HENT: Negative for mouth sores.   Eyes: Negative for visual disturbance.  Respiratory: Negative for cough, chest tightness, shortness of breath and wheezing.   Cardiovascular: Negative for chest pain.  Gastrointestinal: Negative for abdominal pain, constipation, diarrhea, nausea and vomiting.  Endocrine: Negative for polydipsia, polyphagia and polyuria.  Genitourinary: Negative for dysuria, frequency, hematuria and urgency.  Musculoskeletal: Positive for  back pain. Negative for neck stiffness.  Skin: Positive for wound. Negative for rash.  Allergic/Immunologic: Negative for immunocompromised state.  Neurological: Negative for syncope, light-headedness and headaches.  Hematological: Does not bruise/bleed easily.  Psychiatric/Behavioral: Positive for sleep disturbance. The patient is not nervous/anxious.     Physical Exam Updated Vital Signs BP (!) 171/117 (BP Location: Left Arm)   Pulse (!) 109   Temp 98.2 F (36.8 C) (Oral)   Resp 20   Ht 5\' 4"  (1.626  m)   Wt 90.1 kg   SpO2 99%   BMI 34.10 kg/m   Physical Exam Vitals and nursing note reviewed.  Constitutional:      General: She is not in acute distress.    Appearance: She is not diaphoretic.  HENT:     Head: Normocephalic.   Eyes:     General: No scleral icterus.    Conjunctiva/sclera: Conjunctivae normal.  Cardiovascular:     Rate and Rhythm: Normal rate and regular rhythm.     Pulses: Normal pulses.          Radial pulses are 2+ on the right side and 2+ on the left side.  Pulmonary:     Effort: No tachypnea, accessory muscle usage, prolonged expiration, respiratory distress or retractions.     Breath sounds: No stridor.     Comments: Equal chest rise. No increased work of breathing. Abdominal:     General: There is no distension.     Palpations: Abdomen is soft.     Tenderness: There is no abdominal tenderness. There is no guarding or rebound.  Musculoskeletal:     Cervical back: Normal range of motion. Tenderness present. No swelling or deformity. Normal range of motion.     Thoracic back: Tenderness present. No swelling or deformity. Normal range of motion.     Lumbar back: Tenderness present. No swelling or deformity. Normal range of motion.     Comments: Moves all extremities equally and without difficulty.  Skin:    General: Skin is warm and dry.     Capillary Refill: Capillary refill takes less than 2 seconds.  Neurological:     General: No focal deficit present.     Mental Status: She is alert and oriented to person, place, and time.     GCS: GCS eye subscore is 4. GCS verbal subscore is 5. GCS motor subscore is 6.     Comments: Speech is clear and goal oriented. Ambulatory with steady gait. 5/5 strength in the upper and lower extremities. Sensation intact to normal touch upper and lower extremities. Cranial nerves grossly intact.  Psychiatric:        Mood and Affect: Mood normal.     ED Results / Procedures / Treatments   Labs (all labs ordered are  listed, but only abnormal results are displayed) Labs Reviewed - No data to display  EKG None  Radiology DG Thoracic Spine 2 View  Result Date: 09/23/2020 CLINICAL DATA:  Pain related to fall EXAM: THORACIC SPINE 2 VIEWS COMPARISON:  Chest CT 07/17/2020 FINDINGS: No acute fracture or traumatic malalignment. Generalized thoracic disc space narrowing and endplate spurring. Maintained posterior mediastinal fat planes. IMPRESSION: No acute finding. Electronically Signed   By: Monte Fantasia M.D.   On: 09/23/2020 06:38   DG Lumbar Spine Complete  Result Date: 09/23/2020 CLINICAL DATA:  Pain after fall. EXAM: LUMBAR SPINE - COMPLETE 4+ VIEW COMPARISON:  None. FINDINGS: No evidence of acute fracture or traumatic malalignment. Multilevel degenerative facet spurring  mild L3-4 anterolisthesis. Disc space narrowing mainly at L3-4 and below. IMPRESSION: 1. No acute finding. 2. 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 Electronically Signed   By: Monte Fantasia M.D.   On: 09/23/2020 06:46   CT Head Wo Contrast  Result Date: 09/23/2020 CLINICAL DATA:  Fall with posterior head laceration EXAM: CT HEAD WITHOUT CONTRAST CT CERVICAL SPINE WITHOUT CONTRAST TECHNIQUE: Multidetector CT imaging of the head and cervical spine was performed following the standard protocol without intravenous contrast. Multiplanar CT image reconstructions of the cervical spine were also generated. COMPARISON:  None. FINDINGS: CT HEAD FINDINGS Brain: No evidence of acute infarction, hemorrhage, hydrocephalus, extra-axial collection or mass lesion/mass effect. Vascular: No hyperdense vessel or unexpected calcification. Skull: Posterior scalp swelling. Negative for fracture or focal lesion. Sinuses/Orbits: No acute finding. CT CERVICAL SPINE FINDINGS Alignment: No traumatic malalignment. Skull base and vertebrae: No acute fracture. No primary bone lesion or focal pathologic process. Soft tissues and spinal canal: No prevertebral fluid or  swelling. No visible canal hematoma. Asymmetric vocal cord positioning with enlarged piriform sinus on the right. Disc levels:  Generalized spondylitic endplate spurring Upper chest: Negative IMPRESSION: 1. No evidence of acute intracranial or cervical spine injury. 2. Scalp swelling without calvarial fracture. 3. Asymmetric vocal cords which could reflect paresis on the right. Please correlate for hoarseness. Electronically Signed   By: Monte Fantasia M.D.   On: 09/23/2020 06:56   CT Cervical Spine Wo Contrast  Result Date: 09/23/2020 CLINICAL DATA:  Fall with posterior head laceration EXAM: CT HEAD WITHOUT CONTRAST CT CERVICAL SPINE WITHOUT CONTRAST TECHNIQUE: Multidetector CT imaging of the head and cervical spine was performed following the standard protocol without intravenous contrast. Multiplanar CT image reconstructions of the cervical spine were also generated. COMPARISON:  None. FINDINGS: CT HEAD FINDINGS Brain: No evidence of acute infarction, hemorrhage, hydrocephalus, extra-axial collection or mass lesion/mass effect. Vascular: No hyperdense vessel or unexpected calcification. Skull: Posterior scalp swelling. Negative for fracture or focal lesion. Sinuses/Orbits: No acute finding. CT CERVICAL SPINE FINDINGS Alignment: No traumatic malalignment. Skull base and vertebrae: No acute fracture. No primary bone lesion or focal pathologic process. Soft tissues and spinal canal: No prevertebral fluid or swelling. No visible canal hematoma. Asymmetric vocal cord positioning with enlarged piriform sinus on the right. Disc levels:  Generalized spondylitic endplate spurring Upper chest: Negative IMPRESSION: 1. No evidence of acute intracranial or cervical spine injury. 2. Scalp swelling without calvarial fracture. 3. Asymmetric vocal cords which could reflect paresis on the right. Please correlate for hoarseness. Electronically Signed   By: Monte Fantasia M.D.   On: 09/23/2020 06:56     Procedures Procedures   Medications Ordered in ED Medications  Tdap (BOOSTRIX) injection 0.5 mL (0.5 mLs Intramuscular Given 09/23/20 9528)    ED Course  I have reviewed the triage vital signs and the nursing notes.  Pertinent labs & imaging results that were available during my care of the patient were reviewed by me and considered in my medical decision making (see chart for details).    MDM Rules/Calculators/A&P                           Patient presents clinically intoxicated, admits to alcohol usage tonight.  States she fell down several stairs.  3 cm laceration to the occiput.  Hemostasis achieved.  Unknown last tetanus, will be updated today.  Imaging pending.  Patient will need to sober.  7:03 AM At shift change care  was transferred to [facility] who will follow pending studies, re-evaulate and determine disposition.    Final Clinical Impression(s) / ED Diagnoses Final diagnoses:  Injury of head, initial encounter  Laceration of scalp, initial encounter    Rx / DC Orders ED Discharge Orders    None       Aaliya Maultsby, Gwenlyn Perking 09/23/20 [place], [place], DO 09/23/20 [phone]
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
## 2020-09-23 NOTE — ED Triage Notes (Signed)
Pt endorses alcohol use. This nurse asked how much. Pt states "I don't even know"

## 2020-09-23 NOTE — ED Provider Notes (Signed)
  Physical Exam  BP (!) 151/110 (BP Location: Right Arm)   Pulse (!) 110   Temp 98.2 F (36.8 C) (Oral)   Resp 20   Ht 5\' 4"  (1.626 m)   Wt 90.1 kg   SpO2 99%   BMI 34.10 kg/m   Physical Exam Vitals and nursing note reviewed.  Constitutional:      General: She is not in acute distress.    Appearance: She is well-developed.  HENT:     Head: Normocephalic.      Comments: ~2cm lac to the occiput without bleeding Pulmonary:     Effort: Pulmonary effort is normal.  Abdominal:     General: There is no distension.  Musculoskeletal:        General: Normal range of motion.     Cervical back: Normal range of motion.  Skin:    General: Skin is warm.     Findings: No rash.  Neurological:     Mental Status: She is alert and oriented to person, place, and time.     ED Course/Procedures     Procedures  MDM   Patient signed out to me by H Muthersbaugh, PA-C.  Please see previous notes for further history.  In brief, patient tripped on some stairs, sustained a head injury.  Small lack to the occiput.  Pending closure.  X-ray and CT imaging is pending.   X-rays viewed and independently interpreted by me, no acute fracture or dislocation.  CT head and neck negative for acute findings.  On the CT C-spine, does show possible asymmetry of the vocal cords.  On evaluation, patient without significant hoarseness of her voice, states it is not changed from normal.  As such, no concern for acute vocal cord paralysis or airway compromise.  Discussed with patient plan for laceration repair.  Patient is declining.  I discussed risks of not repairing including continued bleeding and infection, patient states she understands but does not want laceration repair.  She is requesting benzodiazepines and narcotic pain medicine.  I discussed that in the setting of a head injury and alcohol use, these are not appropriate medications for her.  Will give Tylenol instead.  As patient is declining lac repair,  there is no further emergent procedure or imaging required for stabilization.  As such, plan for discharge.  Patient states she understands.     Franchot Heidelberg, PA-C 09/23/20 [id number]    Malvin Johns, MD 09/23/20 [phone]

## 2020-09-28 ENCOUNTER — Encounter (HOSPITAL_COMMUNITY): Payer: Self-pay | Admitting: Internal Medicine

## 2020-09-28 ENCOUNTER — Inpatient Hospital Stay (HOSPITAL_COMMUNITY): Payer: Medicaid - Out of State

## 2020-09-28 ENCOUNTER — Inpatient Hospital Stay (HOSPITAL_COMMUNITY)
Admission: EM | Admit: 2020-09-28 | Discharge: 2020-10-01 | DRG: 378 | Disposition: A | Discharge status: Home / Self Care | Payer: Medicaid - Out of State | Attending: Family Medicine | Admitting: Family Medicine

## 2020-09-28 ENCOUNTER — Encounter (HOSPITAL_COMMUNITY)
Admission: EM | Disposition: A | Discharge status: Home / Self Care | Payer: Self-pay | Source: Home / Self Care | Attending: Family Medicine

## 2020-09-28 ENCOUNTER — Inpatient Hospital Stay (HOSPITAL_COMMUNITY): Payer: Medicaid - Out of State | Admitting: Certified Registered"

## 2020-09-28 DIAGNOSIS — F419 Anxiety disorder, unspecified: Secondary | ICD-10-CM | POA: Diagnosis present

## 2020-09-28 DIAGNOSIS — Z86711 Personal history of pulmonary embolism: Secondary | ICD-10-CM

## 2020-09-28 DIAGNOSIS — Z853 Personal history of malignant neoplasm of breast: Secondary | ICD-10-CM

## 2020-09-28 DIAGNOSIS — G4700 Insomnia, unspecified: Secondary | ICD-10-CM | POA: Diagnosis present

## 2020-09-28 DIAGNOSIS — Z20822 Contact with and (suspected) exposure to covid-19: Secondary | ICD-10-CM | POA: Diagnosis present

## 2020-09-28 DIAGNOSIS — G43909 Migraine, unspecified, not intractable, without status migrainosus: Secondary | ICD-10-CM | POA: Diagnosis present

## 2020-09-28 DIAGNOSIS — K922 Gastrointestinal hemorrhage, unspecified: Secondary | ICD-10-CM

## 2020-09-28 DIAGNOSIS — K7010 Alcoholic hepatitis without ascites: Secondary | ICD-10-CM | POA: Diagnosis present

## 2020-09-28 DIAGNOSIS — K289 Gastrojejunal ulcer, unspecified as acute or chronic, without hemorrhage or perforation: Secondary | ICD-10-CM

## 2020-09-28 DIAGNOSIS — D72819 Decreased white blood cell count, unspecified: Secondary | ICD-10-CM | POA: Diagnosis not present

## 2020-09-28 DIAGNOSIS — X58XXXA Exposure to other specified factors, initial encounter: Secondary | ICD-10-CM | POA: Diagnosis present

## 2020-09-28 DIAGNOSIS — Z452 Encounter for adjustment and management of vascular access device: Secondary | ICD-10-CM

## 2020-09-28 DIAGNOSIS — Z87891 Personal history of nicotine dependence: Secondary | ICD-10-CM

## 2020-09-28 DIAGNOSIS — F10220 Alcohol dependence with intoxication, uncomplicated: Secondary | ICD-10-CM | POA: Diagnosis present

## 2020-09-28 DIAGNOSIS — E669 Obesity, unspecified: Secondary | ICD-10-CM | POA: Diagnosis present

## 2020-09-28 DIAGNOSIS — M1990 Unspecified osteoarthritis, unspecified site: Secondary | ICD-10-CM | POA: Diagnosis present

## 2020-09-28 DIAGNOSIS — D696 Thrombocytopenia, unspecified: Secondary | ICD-10-CM | POA: Diagnosis not present

## 2020-09-28 DIAGNOSIS — Z91048 Other nonmedicinal substance allergy status: Secondary | ICD-10-CM | POA: Diagnosis not present

## 2020-09-28 DIAGNOSIS — D62 Acute posthemorrhagic anemia: Secondary | ICD-10-CM | POA: Diagnosis not present

## 2020-09-28 DIAGNOSIS — Z6834 Body mass index (BMI) 34.0-34.9, adult: Secondary | ICD-10-CM | POA: Diagnosis not present

## 2020-09-28 DIAGNOSIS — Z79899 Other long term (current) drug therapy: Secondary | ICD-10-CM

## 2020-09-28 DIAGNOSIS — Z9884 Bariatric surgery status: Secondary | ICD-10-CM

## 2020-09-28 DIAGNOSIS — T7411XA Adult physical abuse, confirmed, initial encounter: Secondary | ICD-10-CM | POA: Diagnosis present

## 2020-09-28 DIAGNOSIS — Z886 Allergy status to analgesic agent status: Secondary | ICD-10-CM | POA: Diagnosis not present

## 2020-09-28 DIAGNOSIS — Z9013 Acquired absence of bilateral breasts and nipples: Secondary | ICD-10-CM | POA: Diagnosis not present

## 2020-09-28 DIAGNOSIS — G4733 Obstructive sleep apnea (adult) (pediatric): Secondary | ICD-10-CM | POA: Diagnosis present

## 2020-09-28 DIAGNOSIS — F99 Mental disorder, not otherwise specified: Secondary | ICD-10-CM

## 2020-09-28 DIAGNOSIS — Y908 Blood alcohol level of 240 mg/100 ml or more: Secondary | ICD-10-CM | POA: Diagnosis present

## 2020-09-28 DIAGNOSIS — F32A Depression, unspecified: Secondary | ICD-10-CM | POA: Diagnosis present

## 2020-09-28 DIAGNOSIS — D6959 Other secondary thrombocytopenia: Secondary | ICD-10-CM | POA: Diagnosis present

## 2020-09-28 DIAGNOSIS — T7491XA Unspecified adult maltreatment, confirmed, initial encounter: Secondary | ICD-10-CM | POA: Diagnosis present

## 2020-09-28 DIAGNOSIS — F10920 Alcohol use, unspecified with intoxication, uncomplicated: Secondary | ICD-10-CM

## 2020-09-28 DIAGNOSIS — K250 Acute gastric ulcer with hemorrhage: Secondary | ICD-10-CM | POA: Diagnosis present

## 2020-09-28 DIAGNOSIS — F10230 Alcohol dependence with withdrawal, uncomplicated: Secondary | ICD-10-CM | POA: Diagnosis present

## 2020-09-28 DIAGNOSIS — E876 Hypokalemia: Secondary | ICD-10-CM | POA: Diagnosis not present

## 2020-09-28 DIAGNOSIS — Z9189 Other specified personal risk factors, not elsewhere classified: Secondary | ICD-10-CM | POA: Diagnosis not present

## 2020-09-28 DIAGNOSIS — S8010XA Contusion of unspecified lower leg, initial encounter: Secondary | ICD-10-CM | POA: Diagnosis present

## 2020-09-28 DIAGNOSIS — F39 Unspecified mood [affective] disorder: Secondary | ICD-10-CM | POA: Diagnosis not present

## 2020-09-28 HISTORY — PX: HOT HEMOSTASIS: SHX5433

## 2020-09-28 HISTORY — PX: ESOPHAGOGASTRODUODENOSCOPY (EGD) WITH PROPOFOL: SHX5813

## 2020-09-28 HISTORY — PX: HEMOSTASIS CLIP PLACEMENT: SHX6857

## 2020-09-28 HISTORY — PX: SCLEROTHERAPY: SHX6841

## 2020-09-28 LAB — POCT I-STAT, CHEM 8
BUN: 17 mg/dL (ref 6–20)
BUN: 17 mg/dL (ref 6–20)
Calcium, Ion: 1.02 mmol/L — ABNORMAL LOW (ref 1.15–1.40)
Calcium, Ion: 0.99 mmol/L — ABNORMAL LOW (ref 1.15–1.40)
Chloride: 101 mmol/L (ref 98–111)
Chloride: 101 mmol/L (ref 98–111)
Creatinine, Ser: 0.4 mg/dL — ABNORMAL LOW (ref 0.44–1.00)
Creatinine, Ser: 0.5 mg/dL (ref 0.44–1.00)
Glucose, Bld: 93 mg/dL (ref 70–99)
Glucose, Bld: 111 mg/dL — ABNORMAL HIGH (ref 70–99)
HCT: 17 % — ABNORMAL LOW (ref 36.0–46.0)
HCT: 26 % — ABNORMAL LOW (ref 36.0–46.0)
Hemoglobin: 5.8 g/dL — CL (ref 12.0–15.0)
Hemoglobin: 8.8 g/dL — ABNORMAL LOW (ref 12.0–15.0)
Potassium: 3.9 mmol/L (ref 3.5–5.1)
Potassium: 4.4 mmol/L (ref 3.5–5.1)
Sodium: 135 mmol/L (ref 135–145)
Sodium: 134 mmol/L — ABNORMAL LOW (ref 135–145)
TCO2: 16 mmol/L — ABNORMAL LOW (ref 22–32)
TCO2: 18 mmol/L — ABNORMAL LOW (ref 22–32)

## 2020-09-28 LAB — CBC WITH DIFFERENTIAL/PLATELET
Abs Immature Granulocytes: 0.02 10*3/uL (ref 0.00–0.07)
Basophils Absolute: 0 10*3/uL (ref 0.0–0.1)
Basophils Relative: 1 %
Eosinophils Absolute: 0 10*3/uL (ref 0.0–0.5)
Eosinophils Relative: 0 %
HCT: 26.1 % — ABNORMAL LOW (ref 36.0–46.0)
Hemoglobin: 9 g/dL — ABNORMAL LOW (ref 12.0–15.0)
Immature Granulocytes: 1 %
Lymphocytes Relative: 30 %
Lymphs Abs: 0.9 10*3/uL (ref 0.7–4.0)
MCH: 31.4 pg (ref 26.0–34.0)
MCHC: 34.5 g/dL (ref 30.0–36.0)
MCV: 90.9 fL (ref 80.0–100.0)
Monocytes Absolute: 0.2 10*3/uL (ref 0.1–1.0)
Monocytes Relative: 7 %
Neutro Abs: 1.7 10*3/uL (ref 1.7–7.7)
Neutrophils Relative %: 61 %
Platelets: 103 10*3/uL — ABNORMAL LOW (ref 150–400)
RBC: 2.87 MIL/uL — ABNORMAL LOW (ref 3.87–5.11)
RDW: 15.1 % (ref 11.5–15.5)
WBC: 2.9 10*3/uL — ABNORMAL LOW (ref 4.0–10.5)
nRBC: 0 % (ref 0.0–0.2)

## 2020-09-28 LAB — PROTIME-INR
INR: 1.2 (ref 0.8–1.2)
Prothrombin Time: 15.5 seconds — ABNORMAL HIGH (ref 11.4–15.2)

## 2020-09-28 LAB — CBC
HCT: 30.7 % — ABNORMAL LOW (ref 36.0–46.0)
Hemoglobin: 10.9 g/dL — ABNORMAL LOW (ref 12.0–15.0)
MCH: 30.7 pg (ref 26.0–34.0)
MCHC: 35.5 g/dL (ref 30.0–36.0)
MCV: 86.5 fL (ref 80.0–100.0)
Platelets: 64 10*3/uL — ABNORMAL LOW (ref 150–400)
RBC: 3.55 MIL/uL — ABNORMAL LOW (ref 3.87–5.11)
RDW: 15.3 % (ref 11.5–15.5)
WBC: 3.9 10*3/uL — ABNORMAL LOW (ref 4.0–10.5)
nRBC: 0 % (ref 0.0–0.2)

## 2020-09-28 LAB — COMPREHENSIVE METABOLIC PANEL
ALT: 166 U/L — ABNORMAL HIGH (ref 0–44)
AST: 391 U/L — ABNORMAL HIGH (ref 15–41)
Albumin: 3 g/dL — ABNORMAL LOW (ref 3.5–5.0)
Alkaline Phosphatase: 79 U/L (ref 38–126)
Anion gap: 20 — ABNORMAL HIGH (ref 5–15)
BUN: 21 mg/dL — ABNORMAL HIGH (ref 6–20)
CO2: 15 mmol/L — ABNORMAL LOW (ref 22–32)
Calcium: 7.6 mg/dL — ABNORMAL LOW (ref 8.9–10.3)
Chloride: 96 mmol/L — ABNORMAL LOW (ref 98–111)
Creatinine, Ser: 0.75 mg/dL (ref 0.44–1.00)
GFR, Estimated: >60 mL/min (ref >60)
Glucose, Bld: 128 mg/dL — ABNORMAL HIGH (ref 70–99)
Potassium: 4.2 mmol/L (ref 3.5–5.1)
Sodium: 131 mmol/L — ABNORMAL LOW (ref 135–145)
Total Bilirubin: 1.6 mg/dL — ABNORMAL HIGH (ref 0.3–1.2)
Total Protein: 5.5 g/dL — ABNORMAL LOW (ref 6.5–8.1)

## 2020-09-28 LAB — ETHANOL: Alcohol, Ethyl (B): 219 mg/dL — ABNORMAL HIGH (ref <10)

## 2020-09-28 LAB — LACTIC ACID, PLASMA
Lactic Acid, Venous: 2.5 mmol/L (ref 0.5–1.9)
Lactic Acid, Venous: 2.8 mmol/L (ref 0.5–1.9)

## 2020-09-28 LAB — CBG MONITORING, ED: Glucose-Capillary: 111 mg/dL — ABNORMAL HIGH (ref 70–99)

## 2020-09-28 LAB — RESP PANEL BY RT-PCR (FLU A&B, COVID) ARPGX2
Influenza A by PCR: NEGATIVE
Influenza B by PCR: NEGATIVE
SARS Coronavirus 2 by RT PCR: NEGATIVE

## 2020-09-28 LAB — ABO/RH: ABO/RH(D): A NEG

## 2020-09-28 LAB — AMMONIA: Ammonia: 27 umol/L (ref 9–35)

## 2020-09-28 LAB — POC OCCULT BLOOD, ED: Fecal Occult Bld: POSITIVE — AB

## 2020-09-28 SURGERY — ESOPHAGOGASTRODUODENOSCOPY (EGD) WITH PROPOFOL
Anesthesia: Monitor Anesthesia Care

## 2020-09-28 MED ORDER — ONDANSETRON HCL 4 MG/2ML IJ SOLN
INTRAMUSCULAR | Status: DC | PRN
  Administered 2020-09-28: 4 mg via INTRAVENOUS

## 2020-09-28 MED ORDER — THIAMINE HCL 100 MG/ML IJ SOLN
100.0000 mg | Freq: Every day | INTRAMUSCULAR | Status: DC
  Administered 2020-09-28 – 2020-09-29 (×2): 100 mg via INTRAVENOUS
  Filled 2020-09-28 (×2): qty 2

## 2020-09-28 MED ORDER — LORAZEPAM 2 MG/ML IJ SOLN
0.0000 mg | Freq: Two times a day (BID) | INTRAMUSCULAR | Status: DC
  Administered 2020-09-30: 2 mg via INTRAVENOUS
  Administered 2020-09-30: 3 mg via INTRAVENOUS
  Administered 2020-10-01: 2 mg via INTRAVENOUS
  Filled 2020-09-28: qty 2
  Filled 2020-09-28: qty 1
  Filled 2020-09-28: qty 2

## 2020-09-28 MED ORDER — CHLORHEXIDINE GLUCONATE CLOTH 2 % EX PADS
6.0000 | MEDICATED_PAD | Freq: Every day | CUTANEOUS | Status: DC
  Administered 2020-09-28 – 2020-09-30 (×3): 6 via TOPICAL

## 2020-09-28 MED ORDER — ONDANSETRON HCL 4 MG/2ML IJ SOLN
4.0000 mg | Freq: Four times a day (QID) | INTRAMUSCULAR | Status: DC | PRN
  Administered 2020-09-29: 4 mg via INTRAVENOUS
  Filled 2020-09-28: qty 2

## 2020-09-28 MED ORDER — FOLIC ACID 1 MG PO TABS
1.0000 mg | ORAL_TABLET | Freq: Every day | ORAL | Status: DC
  Administered 2020-09-28 – 2020-10-01 (×4): 1 mg via ORAL
  Filled 2020-09-28 (×4): qty 1

## 2020-09-28 MED ORDER — LORAZEPAM 2 MG/ML IJ SOLN
0.5000 mg | Freq: Once | INTRAMUSCULAR | Status: AC
  Administered 2020-09-28: 0.5 mg via INTRAVENOUS
  Filled 2020-09-28: qty 1

## 2020-09-28 MED ORDER — LIDOCAINE 2% (20 MG/ML) 5 ML SYRINGE
INTRAMUSCULAR | Status: DC | PRN
  Administered 2020-09-28: 60 mg via INTRAVENOUS

## 2020-09-28 MED ORDER — LORAZEPAM 2 MG/ML IJ SOLN
1.0000 mg | INTRAMUSCULAR | Status: AC | PRN
Start: 2020-09-28 — End: 2020-10-01
  Administered 2020-09-28: 2 mg via INTRAVENOUS
  Administered 2020-09-29: 3 mg via INTRAVENOUS
  Filled 2020-09-28: qty 2

## 2020-09-28 MED ORDER — SUCRALFATE 1 GM/10ML PO SUSP
1.0000 g | Freq: Four times a day (QID) | ORAL | Status: DC
  Administered 2020-09-28 – 2020-10-01 (×13): 1 g via ORAL
  Filled 2020-09-28 (×13): qty 10

## 2020-09-28 MED ORDER — DEXAMETHASONE SODIUM PHOSPHATE 10 MG/ML IJ SOLN
INTRAMUSCULAR | Status: DC | PRN
  Administered 2020-09-28: 10 mg via INTRAVENOUS

## 2020-09-28 MED ORDER — HYDRALAZINE HCL 20 MG/ML IJ SOLN: 5.0000 mg | INTRAMUSCULAR | Status: DC | PRN

## 2020-09-28 MED ORDER — ACETAMINOPHEN 650 MG RE SUPP: 650.0000 mg | Freq: Four times a day (QID) | RECTAL | Status: DC | PRN

## 2020-09-28 MED ORDER — BUPROPION HCL 100 MG PO TABS
100.0000 mg | ORAL_TABLET | Freq: Every day | ORAL | Status: DC
  Administered 2020-09-28: 100 mg via ORAL
  Filled 2020-09-28: qty 1

## 2020-09-28 MED ORDER — SUCCINYLCHOLINE CHLORIDE 200 MG/10ML IV SOSY
PREFILLED_SYRINGE | INTRAVENOUS | Status: DC | PRN
  Administered 2020-09-28: 130 mg via INTRAVENOUS

## 2020-09-28 MED ORDER — SODIUM CHLORIDE 0.9 % IV SOLN
8.0000 mg/h | INTRAVENOUS | Status: DC
  Administered 2020-09-28 – 2020-09-30 (×5): 8 mg/h via INTRAVENOUS
  Filled 2020-09-28 (×7): qty 80

## 2020-09-28 MED ORDER — LACTATED RINGERS IV SOLN: INTRAVENOUS | Status: DC

## 2020-09-28 MED ORDER — ALBUMIN HUMAN 5 % IV SOLN
12.5000 g | Freq: Once | INTRAVENOUS | Status: AC
  Administered 2020-09-28: 12.5 g via INTRAVENOUS

## 2020-09-28 MED ORDER — SODIUM CHLORIDE 0.9% FLUSH
3.0000 mL | Freq: Two times a day (BID) | INTRAVENOUS | Status: DC
  Administered 2020-09-28: 3 mL via INTRAVENOUS

## 2020-09-28 MED ORDER — SODIUM CHLORIDE 0.9 % IV BOLUS
1000.0000 mL | Freq: Once | INTRAVENOUS | Status: AC
  Administered 2020-09-28: 1000 mL via INTRAVENOUS

## 2020-09-28 MED ORDER — MORPHINE SULFATE (PF) 2 MG/ML IV SOLN
2.0000 mg | INTRAVENOUS | Status: DC | PRN
Start: 2020-09-28 — End: 2020-09-30
  Administered 2020-09-29 – 2020-09-30 (×3): 2 mg via INTRAVENOUS
  Filled 2020-09-28 (×3): qty 1

## 2020-09-28 MED ORDER — SODIUM CHLORIDE 0.9 % IV SOLN
80.0000 mg | Freq: Once | INTRAVENOUS | Status: AC
  Administered 2020-09-28: 80 mg via INTRAVENOUS
  Filled 2020-09-28: qty 80

## 2020-09-28 MED ORDER — THIAMINE HCL 100 MG PO TABS
100.0000 mg | ORAL_TABLET | Freq: Every day | ORAL | Status: DC
  Administered 2020-09-30 – 2020-10-01 (×2): 100 mg via ORAL
  Filled 2020-09-28 (×2): qty 1

## 2020-09-28 MED ORDER — ALBUMIN HUMAN 5 % IV SOLN
INTRAVENOUS | Status: AC
  Filled 2020-09-28: qty 250

## 2020-09-28 MED ORDER — SODIUM CHLORIDE (PF) 0.9 % IJ SOLN
PREFILLED_SYRINGE | INTRAMUSCULAR | Status: DC | PRN
  Administered 2020-09-28: 4.5 mL

## 2020-09-28 MED ORDER — LORAZEPAM 2 MG/ML IJ SOLN
0.0000 mg | Freq: Four times a day (QID) | INTRAMUSCULAR | Status: AC
  Administered 2020-09-28: 2 mg via INTRAVENOUS
  Filled 2020-09-28 (×2): qty 1

## 2020-09-28 MED ORDER — LORAZEPAM 1 MG PO TABS: 1.0000 mg | ORAL_TABLET | ORAL | Status: AC | PRN

## 2020-09-28 MED ORDER — PROPOFOL 10 MG/ML IV BOLUS
INTRAVENOUS | Status: DC | PRN
  Administered 2020-09-28: 110 mg via INTRAVENOUS

## 2020-09-28 MED ORDER — PANTOPRAZOLE SODIUM 40 MG IV SOLR
40.0000 mg | Freq: Two times a day (BID) | INTRAVENOUS | Status: DC
  Administered 2020-09-28: 40 mg via INTRAVENOUS
  Filled 2020-09-28: qty 40

## 2020-09-28 MED ORDER — ADULT MULTIVITAMIN W/MINERALS CH
1.0000 | ORAL_TABLET | Freq: Every day | ORAL | Status: DC
  Administered 2020-09-28 – 2020-10-01 (×4): 1 via ORAL
  Filled 2020-09-28 (×4): qty 1

## 2020-09-28 MED ORDER — ACETAMINOPHEN 325 MG PO TABS
650.0000 mg | ORAL_TABLET | Freq: Four times a day (QID) | ORAL | Status: DC | PRN
  Administered 2020-09-29 (×2): 650 mg via ORAL
  Filled 2020-09-28 (×4): qty 2

## 2020-09-28 MED ORDER — ONDANSETRON HCL 4 MG PO TABS: 4.0000 mg | ORAL_TABLET | Freq: Four times a day (QID) | ORAL | Status: DC | PRN

## 2020-09-28 SURGICAL SUPPLY — 14 items

## 2020-09-28 NOTE — Anesthesia Procedure Notes (Signed)
Central Venous Catheter Insertion Performed by: Roberts Gaudy, MD, anesthesiologist Start/End6/06/2020 2:10 PM, 09/28/2020 2:15 PM Patient location: Pre-op. Preanesthetic checklist: patient identified, IV checked, site marked, risks and benefits discussed, surgical consent, monitors and equipment checked, pre-op evaluation, timeout performed and anesthesia consent Lidocaine 1% used for infiltration and patient sedated Hand hygiene performed  and maximum sterile barriers used  Catheter size: 8 Fr Total catheter length 16. Central line was placed.Double lumen Procedure performed using ultrasound guided technique. Attempts: 1 Following insertion, dressing applied and line sutured. Post procedure assessment: blood return through all ports  Patient tolerated the procedure well with no immediate complications.

## 2020-09-28 NOTE — ED Notes (Signed)
Pt taken to Endo at this time
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
## 2020-09-28 NOTE — Transfer of Care (Signed)
Immediate Anesthesia Transfer of Care Note  Patient: Sandra Farmer  Procedure(s) Performed: ESOPHAGOGASTRODUODENOSCOPY (EGD) WITH PROPOFOL (N/A ) SCLEROTHERAPY HOT HEMOSTASIS (ARGON PLASMA COAGULATION/BICAP) (N/A ) HEMOSTASIS CLIP PLACEMENT  Patient Location: PACU  Anesthesia Type:General  Level of Consciousness: awake and alert   Airway & Oxygen Therapy: Patient Spontanous Breathing  Post-op Assessment: Report given to RN and Post -op Vital signs reviewed and stable  Post vital signs: stable  Last Vitals:  Vitals Value Taken Time  BP    Temp    Pulse    Resp    SpO2      Last Pain:  Vitals:   09/28/20 0449  TempSrc: Oral         Complications: No complications documented.
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
## 2020-09-28 NOTE — Op Note (Signed)
[facility] Patient Name: Sandra Farmer Procedure Date : 09/28/2020 MRN: [id number] Attending MD: Docia Chuck. Henrene Pastor , MD Date of Birth: 12-12-1965 CSN: [id number] Age: 55 Admit Type: Inpatient Procedure:                Upper GI endoscopy with control of bleeding (epi                            inject; bicap,clip) Indications:              Acute post hemorrhagic anemia, Melena. Has                            concurrent alcoholic hepatitis. History of                            Roux-en-Y gastric bypass surgery Providers:                Docia Chuck. Henrene Pastor, MD, Elmer Ramp. Tilden Dome, RN, Benetta Spar, Technician Referring MD:             [facility] Medicines:                Monitored Anesthesia. General anesthesia. Complications:            No immediate complications. Estimated Blood Loss:     Estimated blood loss: none. Procedure:                Pre-Anesthesia Assessment:                           - Prior to the procedure, a History and Physical                            was performed, and patient medications and                            allergies were reviewed. The patient's tolerance of                            previous anesthesia was also reviewed. The risks                            and benefits of the procedure and the sedation                            options and risks were discussed with the patient.                            All questions were answered, and informed consent                            was obtained. Prior Anticoagulants: The patient has  taken no previous anticoagulant or antiplatelet                            agents. ASA Grade Assessment: III - A patient with                            severe systemic disease. After reviewing the risks                            and benefits, the patient was deemed in                            satisfactory condition to undergo the procedure.                            After obtaining informed consent, the endoscope was                            passed under direct vision. Throughout the                            procedure, the patient's blood pressure, pulse, and                            oxygen saturations were monitored continuously. The                            GIF-H190 ([id number] Olympus gastroscope was                            introduced through the mouth, and advanced to the                            jejunum. The upper GI endoscopy was accomplished                            without difficulty. The patient tolerated the                            procedure well. Scope In: Scope Out: Findings:      The esophagus was normal.      The stomach revealed evidence of prior Roux-en-Y gastric bypass anatomy       with widely patent anastomosis.      Two gastric ulcers were found at the anastomosis, on the small bowel       side. The largest lesion was 20 mm in largest dimension and had black       pigmentation but no active bleeding or other stigmata. The second ulcer       measured approximately 1 cm with a central vessel and active bleeding.       The ulcer was successfully injected with 3 mL of a 1:10,000 solution of       epinephrine for hemostasis. Coagulation for hemostasis using bipolar       probe was successful. For location marking, one hemostatic clip was  successfully placed at the ulcer edge. There was no bleeding at the end       of the procedure.      The examined jejunum was normal.      No retroflexion performed. Impression:               1. Acute GI bleed secondary to marginal ulceration                            as described status post successful endoscopic                            hemostatic therapy                           2. Status post Roux-en-Y gastric bypass surgery. Recommendation:           1. This patient will require close monitoring in                            stepdown setting                            2. Continuous IV PPI infusion. This has been ordered                           3. Sucralfate slurry's 1 g 4 times daily. This has                            been ordered                           4. Okay to have clear liquids when alert                           5. Continue to monitor blood counts and stools                           6. Transfuse as clinically indicated                           7. If the patient were to develop recurrent                            vigorous bleeding, then consult interventional                            radiology. A marking clip has been placed at the                            ulcer edge to assist with embolization localization Procedure Code(s):        --- Professional ---                           76720, Esophagogastroduodenoscopy, flexible,  transoral; with control of bleeding, any method                           [phone], Unlisted procedure, stomach Diagnosis Code(s):        --- Professional ---                           K25.9, Gastric ulcer, unspecified as acute or                            chronic, without hemorrhage or perforation                           D62, Acute posthemorrhagic anemia                           K92.1, Melena (includes Hematochezia) CPT copyright 2019 American Medical Association. All rights reserved. The codes documented in this report are preliminary and upon coder review may  be revised to meet current compliance requirements. Docia Chuck. Henrene Pastor, MD 09/28/2020 2:50:39 PM This report has been signed electronically. Number of Addenda: 0

## 2020-09-28 NOTE — Anesthesia Procedure Notes (Signed)
Procedure Name: Intubation Performed by: Reeves Dam, CRNA Pre-anesthesia Checklist: Patient identified, Patient being monitored, Timeout performed, Emergency Drugs available and Suction available Patient Re-evaluated:Patient Re-evaluated prior to induction Oxygen Delivery Method: Circle system utilized Preoxygenation: Pre-oxygenation with 100% oxygen Induction Type: IV induction and Rapid sequence Laryngoscope Size: 3 and Miller Grade View: Grade I Tube type: Oral Tube size: 7.0 mm Number of attempts: 1 Airway Equipment and Method: Stylet Placement Confirmation: ETT inserted through vocal cords under direct vision,  positive ETCO2 and breath sounds checked- equal and bilateral Secured at: 21 cm Tube secured with: Tape Dental Injury: Teeth and Oropharynx as per pre-operative assessment
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
## 2020-09-28 NOTE — Consult Note (Signed)
Patient seen and attempted to assess. Patient is unable to participate in psychiatric evaluation at this time. She can not remain awake during initial evaluation and continues to fall asleep "snoring".    Chart review shows that patient has history of alcohol use and has presented multiple times in the past 30 days with alcohol intoxication. She is prescribed Wellbutrin outpatient. At this time will discontinue this medication as alcohol use and Wellbutrin counteract, increasing depressive symptoms, reduces efficacy of medication, and increase risk for seizures. WIll attempt to reassess tomorrow.   -Continue Ativan CIWA protocol.  -D/C Wellbutrin -Recommend TOC for assistance with psychosocial issues and stressors. Patient will benefit from acute inpatient substance abuse treatment once medically stable, and then consider [facility] .  -Psychiatry to see tomorrow, once patient is able to participate.

## 2020-09-28 NOTE — ED Notes (Signed)
Patient reported needing to urinate. Assisted patient to the bedside commode. Patient started rocking back and forth on the commode. Instructed patient to sit still to prevent a fall. Patient proceeded to lean back and stiffen extremities and would not respond to name or sternal rub. I asked the patient if she could speak to me and she shook her head "No." Vitals WNL during this time. The patient relaxed extremities and patient stated, "This is happening because I am in pain. I need more ativan." Patient expressed need to sleep since she has been awake for the last 62 hours. Provider notified of events.

## 2020-09-28 NOTE — ED Notes (Signed)
Attempted to call report

## 2020-09-28 NOTE — Anesthesia Postprocedure Evaluation (Signed)
Anesthesia Post Note  Patient: KAICEE SCARPINO  Procedure(s) Performed: ESOPHAGOGASTRODUODENOSCOPY (EGD) WITH PROPOFOL (N/A ) SCLEROTHERAPY HOT HEMOSTASIS (ARGON PLASMA COAGULATION/BICAP) (N/A ) HEMOSTASIS CLIP PLACEMENT     Patient location during evaluation: Endoscopy Anesthesia Type: General Level of consciousness: awake, oriented, sedated, lethargic and patient cooperative Pain management: pain level controlled Vital Signs Assessment: post-procedure vital signs reviewed and stable Respiratory status: spontaneous breathing, respiratory function stable and patient connected to nasal cannula oxygen Cardiovascular status: blood pressure returned to baseline Postop Assessment: no headache Anesthetic complications: no   No complications documented.  Last Vitals:  Vitals:   09/28/20 1505 09/28/20 1510  BP: 140/89   Pulse: (!) 105 (!) 110  Resp: (!) 21 (!) 23  Temp:    SpO2: 96% 99%    Last Pain:  Vitals:   09/28/20 1455  TempSrc:   PainSc: 0-No pain                 Taquana Bartley COKER
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
## 2020-09-28 NOTE — ED Notes (Signed)
Admitting at bedside 

## 2020-09-28 NOTE — Anesthesia Preprocedure Evaluation (Signed)
Anesthesia Evaluation  Patient identified by MRN, date of birth, ID band Patient confused    Reviewed: Allergy & Precautions, NPO status , Patient's Chart, lab work & pertinent test results  Airway Mallampati: II  TM Distance: >3 FB Neck ROM: Full    Dental  (+) Teeth Intact, Dental Advisory Given   Pulmonary former smoker,    breath sounds clear to auscultation       Cardiovascular  Rhythm:Regular Rate:Tachycardia     Neuro/Psych    GI/Hepatic   Endo/Other    Renal/GU      Musculoskeletal   Abdominal   Peds  Hematology   Anesthesia Other Findings   Reproductive/Obstetrics                             Anesthesia Physical Anesthesia Plan  ASA: IV and emergent  Anesthesia Plan: General   Post-op Pain Management:    Induction: Intravenous, Cricoid pressure planned and Rapid sequence  PONV Risk Score and Plan: Ondansetron and Dexamethasone  Airway Management Planned: Oral ETT  Additional Equipment: CVP  Intra-op Plan:   Post-operative Plan: Extubation in OR  Informed Consent: I have reviewed the patients History and Physical, chart, labs and discussed the procedure including the risks, benefits and alternatives for the proposed anesthesia with the patient or authorized representative who has indicated his/her understanding and acceptance.     Dental advisory given  Plan Discussed with: CRNA and Anesthesiologist  Anesthesia Plan Comments:         Anesthesia Quick Evaluation

## 2020-09-29 DIAGNOSIS — D696 Thrombocytopenia, unspecified: Secondary | ICD-10-CM

## 2020-09-29 DIAGNOSIS — F39 Unspecified mood [affective] disorder: Secondary | ICD-10-CM

## 2020-09-29 DIAGNOSIS — K250 Acute gastric ulcer with hemorrhage: Principal | ICD-10-CM

## 2020-09-29 DIAGNOSIS — D72819 Decreased white blood cell count, unspecified: Secondary | ICD-10-CM

## 2020-09-29 DIAGNOSIS — F10220 Alcohol dependence with intoxication, uncomplicated: Secondary | ICD-10-CM | POA: Diagnosis not present

## 2020-09-29 DIAGNOSIS — Z9884 Bariatric surgery status: Secondary | ICD-10-CM

## 2020-09-29 DIAGNOSIS — E669 Obesity, unspecified: Secondary | ICD-10-CM

## 2020-09-29 DIAGNOSIS — D62 Acute posthemorrhagic anemia: Secondary | ICD-10-CM | POA: Diagnosis not present

## 2020-09-29 DIAGNOSIS — F10230 Alcohol dependence with withdrawal, uncomplicated: Secondary | ICD-10-CM | POA: Diagnosis not present

## 2020-09-29 DIAGNOSIS — K922 Gastrointestinal hemorrhage, unspecified: Secondary | ICD-10-CM | POA: Diagnosis not present

## 2020-09-29 DIAGNOSIS — Z9189 Other specified personal risk factors, not elsewhere classified: Secondary | ICD-10-CM

## 2020-09-29 LAB — CBC
HCT: 29.1 % — ABNORMAL LOW (ref 36.0–46.0)
HCT: 30.8 % — ABNORMAL LOW (ref 36.0–46.0)
Hemoglobin: 10.4 g/dL — ABNORMAL LOW (ref 12.0–15.0)
Hemoglobin: 11 g/dL — ABNORMAL LOW (ref 12.0–15.0)
MCH: 30.9 pg (ref 26.0–34.0)
MCH: 30.9 pg (ref 26.0–34.0)
MCHC: 35.7 g/dL (ref 30.0–36.0)
MCHC: 35.7 g/dL (ref 30.0–36.0)
MCV: 86.4 fL (ref 80.0–100.0)
MCV: 86.5 fL (ref 80.0–100.0)
Platelets: 62 10*3/uL — ABNORMAL LOW (ref 150–400)
Platelets: 59 10*3/uL — ABNORMAL LOW (ref 150–400)
RBC: 3.37 MIL/uL — ABNORMAL LOW (ref 3.87–5.11)
RBC: 3.56 MIL/uL — ABNORMAL LOW (ref 3.87–5.11)
RDW: 15.8 % — ABNORMAL HIGH (ref 11.5–15.5)
RDW: 15.9 % — ABNORMAL HIGH (ref 11.5–15.5)
WBC: 4.3 10*3/uL (ref 4.0–10.5)
WBC: 4.5 10*3/uL (ref 4.0–10.5)
nRBC: 0.5 % — ABNORMAL HIGH (ref 0.0–0.2)
nRBC: 0 % (ref 0.0–0.2)

## 2020-09-29 LAB — BASIC METABOLIC PANEL
Anion gap: 8 (ref 5–15)
BUN: 13 mg/dL (ref 6–20)
CO2: 24 mmol/L (ref 22–32)
Calcium: 8 mg/dL — ABNORMAL LOW (ref 8.9–10.3)
Chloride: 103 mmol/L (ref 98–111)
Creatinine, Ser: 0.63 mg/dL (ref 0.44–1.00)
GFR, Estimated: >60 mL/min (ref >60)
Glucose, Bld: 122 mg/dL — ABNORMAL HIGH (ref 70–99)
Potassium: 3.8 mmol/L (ref 3.5–5.1)
Sodium: 135 mmol/L (ref 135–145)

## 2020-09-29 LAB — TYPE AND SCREEN
ABO/RH(D): A NEG
Antibody Screen: NEGATIVE
Unit division: 0
Unit division: 0
Unit division: 0
Unit division: 0

## 2020-09-29 LAB — BPAM RBC
Blood Product Expiration Date: 202206152359
Blood Product Expiration Date: 202206242359
Blood Product Expiration Date: 202206262359
Blood Product Expiration Date: 202206262359
ISSUE DATE / TIME: 202206031248
ISSUE DATE / TIME: 202206031248
ISSUE DATE / TIME: 202206031248
ISSUE DATE / TIME: 202206031738
Unit Type and Rh: 600
Unit Type and Rh: 600
Unit Type and Rh: 600
Unit Type and Rh: 600

## 2020-09-29 LAB — HEMOGLOBIN AND HEMATOCRIT, BLOOD
HCT: 29.9 % — ABNORMAL LOW (ref 36.0–46.0)
Hemoglobin: 10.6 g/dL — ABNORMAL LOW (ref 12.0–15.0)

## 2020-09-29 LAB — URINALYSIS, ROUTINE W REFLEX MICROSCOPIC
Bilirubin Urine: NEGATIVE
Glucose, UA: NEGATIVE mg/dL
Ketones, ur: NEGATIVE mg/dL
Nitrite: NEGATIVE
Protein, ur: NEGATIVE mg/dL
Specific Gravity, Urine: 1.005 (ref 1.005–1.030)
pH: 7 (ref 5.0–8.0)

## 2020-09-29 LAB — RAPID URINE DRUG SCREEN, HOSP PERFORMED
Amphetamines: NOT DETECTED
Barbiturates: NOT DETECTED
Benzodiazepines: NOT DETECTED
Cocaine: NOT DETECTED
Opiates: POSITIVE — AB
Tetrahydrocannabinol: NOT DETECTED

## 2020-09-29 LAB — GLUCOSE, CAPILLARY: Glucose-Capillary: 135 mg/dL — ABNORMAL HIGH (ref 70–99)

## 2020-09-29 MED ORDER — SUMATRIPTAN SUCCINATE 50 MG PO TABS
50.0000 mg | ORAL_TABLET | ORAL | Status: DC | PRN
  Administered 2020-09-29 – 2020-10-01 (×6): 50 mg via ORAL
  Filled 2020-09-29 (×8): qty 1

## 2020-09-29 MED ORDER — SODIUM CHLORIDE 0.9% FLUSH: 10.0000 mL | Freq: Two times a day (BID) | INTRAVENOUS | Status: DC

## 2020-09-29 MED ORDER — SODIUM CHLORIDE 0.9% FLUSH: 10.0000 mL | INTRAVENOUS | Status: DC | PRN

## 2020-09-29 MED ORDER — BUSPIRONE HCL 5 MG PO TABS
5.0000 mg | ORAL_TABLET | Freq: Three times a day (TID) | ORAL | Status: DC
  Administered 2020-09-29 – 2020-10-01 (×5): 5 mg via ORAL
  Filled 2020-09-29 (×5): qty 1

## 2020-09-29 NOTE — Plan of Care (Signed)
  Problem: Nutrition: Goal: Adequate nutrition will be maintained Outcome: Progressing   
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
## 2020-09-29 NOTE — Progress Notes (Addendum)
[facility] Progress Note  CC:  GI bleed and anemia; ETOH abuse   Subjective:  Patient is stable.  No particular complaints.  No reports of signs of ongoing bleeding.  Hgb stable.    Objective:  Vital signs in last 24 hours: Temp:  [98 F (36.7 C)-99 F (37.2 C)] 98 F (36.7 C) (06/04 0935) Pulse Rate:  [92-124] 96 (06/04 0418) Resp:  [16-28] 18 (06/04 0935) BP: (82-167)/(54-101) 132/80 (06/04 0935) SpO2:  [95 %-100 %] 99 % (06/04 0935) Last BM Date: 09/28/20 General:  Alert, Well-developed, in NAD Heart:  Regular rate and rhythm; no murmurs Pulm:  CTAB.  No W/R/R. Abdomen:  Soft, non-distended.  BS present.  Non-tender. Extremities:  Without edema.  Intake/Output from previous day: 06/03 0701 - 06/04 0700 In: 3231.6 [P.O.:300; I.V.:1748.6; Blood:1182.9] Out: 700 [Urine:700] Intake/Output this shift: Total I/O In: 240 [P.O.:240] Out: 350 [Urine:350]  Lab Results: Recent Labs    09/28/20 0455 09/28/20 1308 09/28/20 2209 09/28/20 2349 09/29/20 0803  WBC 2.9*  --  3.9*  --  4.3  HGB 9.0*   < > 10.9* 10.6* 10.4*  HCT 26.1*   < > 30.7* 29.9* 29.1*  PLT 103*  --  64*  --  62*   < > = values in this interval not displayed.   BMET Recent Labs    09/28/20 0455 09/28/20 1308 09/28/20 1445 09/29/20 0803  NA 131* 135 134* 135  K 4.2 3.9 4.4 3.8  CL 96* 101 101 103  CO2 15*  --   --  24  GLUCOSE 128* 93 111* 122*  BUN 21* 17 17 13   CREATININE 0.75 0.40* 0.50 0.63  CALCIUM 7.6*  --   --  8.0*   LFT Recent Labs    09/28/20 0455  PROT 5.5*  ALBUMIN 3.0*  AST 391*  ALT 166*  ALKPHOS 79  BILITOT 1.6*   PT/INR Recent Labs    09/28/20 0455  LABPROT 15.5*  INR 1.2   DG CHEST PORT 1 VIEW  Result Date: 09/28/2020 CLINICAL DATA:  Central line placement EXAM: PORTABLE CHEST 1 VIEW COMPARISON:  09/09/2020 FINDINGS: Right internal jugular central line tip in the SVC 2 cm above the right atrium. No pneumothorax. The lungs are clear. The  vascularity is normal. No effusions. IMPRESSION: Central line tip in the SVC 2 cm above the right atrium. No pneumothorax. Electronically Signed   By: Nelson Chimes M.D.   On: 09/28/2020 15:13   Assessment / Plan: *UGI bleed secondary to marginal ulceration as described status post successful endoscopic hemostatic therapy on 6/3.  *Blood loss anemia:  ? Received 3 or 4 units PRBCs.  Hgb stable this AM at 10.4 grams.  *Alcoholic hepatitis.  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  Reports 70# wt loss since then.    *Depression, anxiety, significant social stressors.    *Thrombocytopenia at 60, new.  -Continue PPI gtt x 72 hours. -Continue carafate slurry four times daily. -ETOH abstinence. -Continue to monitor Hgb and transfuse further prn. -If re-bleeds then need to consult IR for possible embolization.   LOS: 1 day   Laban Emperor. Zehr  09/29/2020, 12:12 PM  GI ATTENDING  Interval history data reviewed.  Patient personally seen and examined.  Agree with interval progress note as outlined above including recommendations as highlighted.  This patient will need to be in the hospital until she completes 72 hours of IV PPI therapy.  If stable at that  point, convert to p.o. PPI.  Thus, she should go home before Monday.  We will continue to follow.  Docia Chuck. Geri Seminole., M.D. [facility] Division of Gastroenterology

## 2020-09-29 NOTE — Progress Notes (Signed)
Patient made aware that Lucile Shutters called and if it is ok for the patient to give some medical information to Rita,patient responded "no,all I want from her is my cell phone ,and my charger.She is not allow to visit me and let me speak to her ,next time she is going to call back here,and wake me up".
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
## 2020-09-29 NOTE — Progress Notes (Signed)
New Admission Note:   Arrival Method:via stretcher from ED Mental Orientation: Alert and oriented x 4 Telemetry: 5M18, CCMD notified Assessment: to be completed Skin: Intact, refer to flowsheet IV:  Rwrist and LAC nsl, CVC double lumen RIJ infusing with protonix 80mg  @10ml /hr  Pain: 0/10 Tubes: None Safety Measures: Safety Fall Prevention Plan has been discussed  Admission: to be completed 5 [place] Orientation: Patient has been oriented to the room, unit and staff.   Family: none at bedside  Orders to be reviewed and implemented. Will continue to monitor the patient. Call light has been placed within reach and bed alarm has been activated.

## 2020-09-30 DIAGNOSIS — K250 Acute gastric ulcer with hemorrhage: Secondary | ICD-10-CM | POA: Diagnosis not present

## 2020-09-30 DIAGNOSIS — K922 Gastrointestinal hemorrhage, unspecified: Secondary | ICD-10-CM | POA: Diagnosis not present

## 2020-09-30 DIAGNOSIS — F10920 Alcohol use, unspecified with intoxication, uncomplicated: Secondary | ICD-10-CM

## 2020-09-30 DIAGNOSIS — K7010 Alcoholic hepatitis without ascites: Secondary | ICD-10-CM

## 2020-09-30 DIAGNOSIS — D62 Acute posthemorrhagic anemia: Secondary | ICD-10-CM

## 2020-09-30 LAB — COMPREHENSIVE METABOLIC PANEL
ALT: 148 U/L — ABNORMAL HIGH (ref 0–44)
AST: 333 U/L — ABNORMAL HIGH (ref 15–41)
Albumin: 2.7 g/dL — ABNORMAL LOW (ref 3.5–5.0)
Alkaline Phosphatase: 64 U/L (ref 38–126)
Anion gap: 8 (ref 5–15)
BUN: 6 mg/dL (ref 6–20)
CO2: 25 mmol/L (ref 22–32)
Calcium: 7.9 mg/dL — ABNORMAL LOW (ref 8.9–10.3)
Chloride: 102 mmol/L (ref 98–111)
Creatinine, Ser: 0.56 mg/dL (ref 0.44–1.00)
GFR, Estimated: >60 mL/min (ref >60)
Glucose, Bld: 96 mg/dL (ref 70–99)
Potassium: 2.9 mmol/L — ABNORMAL LOW (ref 3.5–5.1)
Sodium: 135 mmol/L (ref 135–145)
Total Bilirubin: 1.1 mg/dL (ref 0.3–1.2)
Total Protein: 4.7 g/dL — ABNORMAL LOW (ref 6.5–8.1)

## 2020-09-30 LAB — CBC
HCT: 30.7 % — ABNORMAL LOW (ref 36.0–46.0)
Hemoglobin: 10.6 g/dL — ABNORMAL LOW (ref 12.0–15.0)
MCH: 30.9 pg (ref 26.0–34.0)
MCHC: 34.5 g/dL (ref 30.0–36.0)
MCV: 89.5 fL (ref 80.0–100.0)
Platelets: 58 10*3/uL — ABNORMAL LOW (ref 150–400)
RBC: 3.43 MIL/uL — ABNORMAL LOW (ref 3.87–5.11)
RDW: 15.8 % — ABNORMAL HIGH (ref 11.5–15.5)
WBC: 3 10*3/uL — ABNORMAL LOW (ref 4.0–10.5)
nRBC: 0 % (ref 0.0–0.2)

## 2020-09-30 LAB — PROTIME-INR
INR: 1.1 (ref 0.8–1.2)
Prothrombin Time: 13.9 seconds (ref 11.4–15.2)

## 2020-09-30 MED ORDER — POTASSIUM CHLORIDE CRYS ER 20 MEQ PO TBCR
40.0000 meq | EXTENDED_RELEASE_TABLET | ORAL | Status: AC
  Administered 2020-09-30 (×2): 40 meq via ORAL
  Filled 2020-09-30 (×2): qty 2

## 2020-09-30 MED ORDER — OXYCODONE HCL 5 MG PO TABS
5.0000 mg | ORAL_TABLET | Freq: Three times a day (TID) | ORAL | Status: DC | PRN
  Administered 2020-10-01: 5 mg via ORAL
  Filled 2020-09-30: qty 1

## 2020-09-30 MED ORDER — MELATONIN 5 MG PO TABS
10.0000 mg | ORAL_TABLET | Freq: Every evening | ORAL | Status: DC | PRN
  Administered 2020-10-01: 10 mg via ORAL
  Filled 2020-09-30: qty 2

## 2020-09-30 NOTE — Plan of Care (Signed)

## 2020-09-30 NOTE — Progress Notes (Addendum)
[facility] Progress Note  CC:  GI bleed, anemia, ETOH abuse  Subjective:  No further signs of active bleeding.  Tolerating diet.  Only complains of headache currently.  Objective:  Vital signs in last 24 hours: Temp:  [97.8 F (36.6 C)-98.4 F (36.9 C)] 97.8 F (36.6 C) (06/05 1007) Pulse Rate:  [91-100] 94 (06/05 1007) Resp:  [17-18] 18 (06/05 1007) BP: (123-155)/(79-109) 141/92 (06/05 1007) SpO2:  [97 %-98 %] 98 % (06/05 1007) Last BM Date: 09/29/20 General:   Alert,  Well-developed,    in NAD Heart:  Regular rate and rhythm; no murmurs Pulm:  CTAB.  No W/R/R. Abdomen:  Soft, non-distended.  BS present.  Non-tender.  Extremities:  Without edema. Neurologic:  Alert and oriented x 4;  grossly normal neurologically.  Intake/Output from previous day: 06/04 0701 - 06/05 0700 In: 2597.2 [P.O.:1180; I.V.:1417.2] Out: 1800 [Urine:1800] Intake/Output this shift: Total I/O In: 480 [P.O.:480] Out: 1175 [Urine:1175]  Lab Results: Recent Labs    09/29/20 0803 09/29/20 1623 09/30/20 0413  WBC 4.3 4.5 3.0*  HGB 10.4* 11.0* 10.6*  HCT 29.1* 30.8* 30.7*  PLT 62* 59* 58*   BMET Recent Labs    09/28/20 0455 09/28/20 1308 09/28/20 1445 09/29/20 0803 09/30/20 1113  NA 131*   < > 134* 135 135  K 4.2   < > 4.4 3.8 2.9*  CL 96*   < > 101 103 102  CO2 15*  --   --  24 25  GLUCOSE 128*   < > 111* 122* 96  BUN 21*   < > 17 13 6   CREATININE 0.75   < > 0.50 0.63 0.56  CALCIUM 7.6*  --   --  8.0* 7.9*   < > = values in this interval not displayed.   LFT Recent Labs    09/30/20 1113  PROT 4.7*  ALBUMIN 2.7*  AST 333*  ALT 148*  ALKPHOS 64  BILITOT 1.1   PT/INR Recent Labs    09/28/20 0455 09/30/20 1113  LABPROT 15.5* 13.9  INR 1.2 1.1   DG CHEST PORT 1 VIEW  Result Date: 09/28/2020 CLINICAL DATA:  Central line placement EXAM: PORTABLE CHEST 1 VIEW COMPARISON:  09/09/2020 FINDINGS: Right internal jugular central line tip in the SVC 2 cm above  the right atrium. No pneumothorax. The lungs are clear. The vascularity is normal. No effusions. IMPRESSION: Central line tip in the SVC 2 cm above the right atrium. No pneumothorax. Electronically Signed   By: Nelson Chimes M.D.   On: 09/28/2020 15:13   Assessment / Plan: *UGI bleed secondary to marginal ulceration as described status post successful endoscopic hemostatic therapy on 6/3.  *Blood loss anemia:  Received 3 units PRBCs.  Hgb stable this AM at 10.6 grams this morning.  *Alcoholic hepatitis.  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 Reports 70# wt loss since then.   *Depression, anxiety, significant social stressors.   *Thrombocytopenia at 62, new.  *Hypokalemia with K+ 2.9:  Replacement per primary service.  -Continue PPI gtt x 72 hours. -Continue carafate slurry four times daily. -ETOH abstinence. -Continue to monitor Hgb and transfuse further prn. -If re-bleeds then need to consult IR for possible embolization.   LOS: 2 days   Laban Emperor. Zehr  09/30/2020, 12:51 PM  GI ATTENDING  Interval history data reviewed.  Patient seen and examined.  Agree with several progress note as outlined above.  No evidence of further GI bleeding.  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  She also has alcoholic hepatitis.  Agree with recommendations as above.  Primary service to address hypokalemia.  GI will follow.  Docia Chuck. Geri Seminole., M.D. [facility] Division of Gastroenterology

## 2020-09-30 NOTE — Plan of Care (Signed)
  Problem: Nutrition: Goal: Adequate nutrition will be maintained Outcome: Progressing   
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
## 2020-10-01 ENCOUNTER — Other Ambulatory Visit: Payer: Self-pay

## 2020-10-01 ENCOUNTER — Encounter (HOSPITAL_COMMUNITY): Payer: Self-pay | Admitting: Internal Medicine

## 2020-10-01 DIAGNOSIS — K289 Gastrojejunal ulcer, unspecified as acute or chronic, without hemorrhage or perforation: Secondary | ICD-10-CM | POA: Diagnosis not present

## 2020-10-01 DIAGNOSIS — D62 Acute posthemorrhagic anemia: Secondary | ICD-10-CM | POA: Diagnosis not present

## 2020-10-01 DIAGNOSIS — K922 Gastrointestinal hemorrhage, unspecified: Secondary | ICD-10-CM | POA: Diagnosis not present

## 2020-10-01 DIAGNOSIS — K7010 Alcoholic hepatitis without ascites: Secondary | ICD-10-CM | POA: Diagnosis not present

## 2020-10-01 LAB — CBC
HCT: 32.7 % — ABNORMAL LOW (ref 36.0–46.0)
Hemoglobin: 11 g/dL — ABNORMAL LOW (ref 12.0–15.0)
MCH: 31 pg (ref 26.0–34.0)
MCHC: 33.6 g/dL (ref 30.0–36.0)
MCV: 92.1 fL (ref 80.0–100.0)
Platelets: 76 10*3/uL — ABNORMAL LOW (ref 150–400)
RBC: 3.55 MIL/uL — ABNORMAL LOW (ref 3.87–5.11)
RDW: 15.7 % — ABNORMAL HIGH (ref 11.5–15.5)
WBC: 3.1 10*3/uL — ABNORMAL LOW (ref 4.0–10.5)
nRBC: 0 % (ref 0.0–0.2)

## 2020-10-01 LAB — BASIC METABOLIC PANEL
Anion gap: 5 (ref 5–15)
BUN: 5 mg/dL — ABNORMAL LOW (ref 6–20)
CO2: 29 mmol/L (ref 22–32)
Calcium: 8.5 mg/dL — ABNORMAL LOW (ref 8.9–10.3)
Chloride: 103 mmol/L (ref 98–111)
Creatinine, Ser: 0.61 mg/dL (ref 0.44–1.00)
GFR, Estimated: >60 mL/min (ref >60)
Glucose, Bld: 101 mg/dL — ABNORMAL HIGH (ref 70–99)
Potassium: 3.4 mmol/L — ABNORMAL LOW (ref 3.5–5.1)
Sodium: 137 mmol/L (ref 135–145)

## 2020-10-01 MED ORDER — PANTOPRAZOLE SODIUM 40 MG PO TBEC: 40.0000 mg | DELAYED_RELEASE_TABLET | Freq: Two times a day (BID) | ORAL | Status: DC

## 2020-10-01 MED ORDER — BUSPIRONE HCL 5 MG PO TABS: 5.0000 mg | ORAL_TABLET | Freq: Three times a day (TID) | ORAL | 1 refills | Status: AC

## 2020-10-01 MED ORDER — OMEPRAZOLE 40 MG PO CPDR: 40.0000 mg | DELAYED_RELEASE_CAPSULE | Freq: Two times a day (BID) | ORAL | 0 refills | Status: AC

## 2020-10-01 MED ORDER — POTASSIUM CHLORIDE CRYS ER 20 MEQ PO TBCR
40.0000 meq | EXTENDED_RELEASE_TABLET | Freq: Once | ORAL | Status: AC
  Administered 2020-10-01: 40 meq via ORAL
  Filled 2020-10-01: qty 2

## 2020-10-01 MED ORDER — FOLIC ACID 1 MG PO TABS: 1.0000 mg | ORAL_TABLET | Freq: Every day | ORAL | 0 refills | Status: AC

## 2020-10-01 MED ORDER — SUCRALFATE 1 GM/10ML PO SUSP: 1.0000 g | Freq: Three times a day (TID) | ORAL | 0 refills | Status: AC

## 2020-10-01 MED ORDER — PNEUMOCOCCAL VAC POLYVALENT 25 MCG/0.5ML IJ INJ: 0.5000 mL | INJECTION | INTRAMUSCULAR | Status: DC
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
## 2020-10-01 NOTE — Discharge Instructions (Signed)
Alcohol Intoxication Alcohol intoxication happens when you cannot think clearly or function well (get impaired) after drinking alcohol. This can happen after just one drink. The effect that alcohol has on how you think and function depends on:  How much alcohol you drank.  Your age, your weight, and whether you are a man or a woman.  How often you drink alcohol.  If you have other medical problems. Alcohol intoxication can range from mild to very bad. It can be dangerous, especially if you:  Drink a large amount of alcohol in a short time (binge drink). ? For women, binge drinking is having four or more drinks at one time. ? For men, binge drinking is having five or more drinks at one time.  Take certain drugs or medicines. If you or anyone around you seems intoxicated:  Tell someone.  Get help from someone. Follow these instructions at home: Eating and drinking  Ask your doctor if alcohol is safe for you. ? If your doctor says that alcohol is safe for you, limit how much you drink to no more than 1 drink a day for women who are not pregnant and 2 drinks a day for men. One drink equals one of these:  12 oz of beer.  5 oz of wine.  1 oz of hard liquor. ? Do not drink alcohol if:  Your doctor tells you not to drink.  You are pregnant, may be pregnant, or are planning to get pregnant.  You are under the legal drinking age (57 years old in the [place]  You are taking medicines that you should not take with alcohol.  Alcohol causes your medical problem to get worse.  You have to drive or do activities that need you to be alert.  You have substance use disorder. This is when using alcohol again and again causes problems with your health, your relationships, or with what you need to do at work, home, or school. ? Be sure to eat before you drink alcohol. Avoid drinking when you have an empty stomach. ? Make sure you have enough fluid in your body (stay hydrated). To do  this:  Drink enough fluid to keep your pee (urine) pale yellow.  Avoid caffeine, which may be in coffee, tea, and some sodas. Caffeine can make you thirsty. ? Try not to drink more than one drink an hour. ? If you are having more than one drink, have a drink without alcohol (such as water) between your drinks.   General instructions  Take over-the-counter and prescription medicines only as told by your doctor.  Do not drive after drinking any amount of alcohol. Plan for a designated driver or another way to go home.  Have someone you trust stay with you while you are intoxicated. Youshould not be left alone.  Keep all follow-up visits as told by your doctor. This is important.   Contact a doctor if:  You do not feel better after a few days.  You have problems at work, at school, or at home due to drinking. Get help right away if:  You have any of the following: ? Moderate or very bad trouble with:  Movement (coordination).  Talking.  Memory.  Paying attention to things. ? Trouble staying awake. ? Being very confused. ? Jerky movements that you cannot control (seizure). ? Light-headedness. ? Fainting. ? Throwing up (vomiting) blood. The blood may be bright red or look like coffee grounds. ? Blood in your poop (stool). The blood  may:  Be bright red.  Make your poop black and tarry and make it smell bad. ? Feeling shaky when you try to stop drinking. ? Thoughts about hurting yourself or others. If you ever feel like you may hurt yourself or others, or have thoughts about taking your own life, get help right away. You can go to your nearest emergency department or call:  Your local emergency services (911 in the [place]  A suicide crisis helpline, such as the [place] at (727)660-1157. This is open 24 hours a day. Summary  Alcohol intoxication happens when you cannot think clearly or function well (get impaired) after drinking alcohol. This  can happen after just one drink.  If your doctor says that alcohol is safe for you, limit how much you drink to no more than 1 drink a day for women who are not pregnant and 2 drinks a day for men.  Contact a doctor if you have problems at work, at school, or at home due to drinking.  Get help right away if you have thoughts about hurting yourself or others. This information is not intended to replace advice given to you by your health care provider. Make sure you discuss any questions you have with your health care provider. Document Revised: 09/15/2018 Document Reviewed: 08/04/2017 Elsevier Patient Education  [place].

## 2020-10-01 NOTE — Plan of Care (Signed)

## 2020-10-01 NOTE — Clinical Social Work Note (Signed)
Patient medically stable for discharge and is being transported home by [facility] arranged transportation. CSW confirmed address and reviewed Buyer, retail and Release of Liability form with patient and she signed form. It was faxed to [facility] transportation.  Philippe Gang Givens, MSW, LCSW Licensed Clinical Social Worker [place] [phone]
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
## 2020-10-01 NOTE — Progress Notes (Signed)
DISCHARGE NOTE HOME [place] [facility] to be discharged home per MD order. Discussed prescriptions and follow up appointments with the patient. Prescriptions given to patient; medication list explained in detail. Patient verbalized understanding.  Skin clean, dry and intact without evidence of skin break down, no evidence of skin tears noted. IV catheter discontinued intact. Site without signs and symptoms of complications. Dressing and pressure applied. Pt denies pain at the site currently. No complaints noted.  Patient free of lines, drains, and wounds.   An After Visit Summary (AVS) was printed and given to the patient. Patient escorted via wheelchair, and discharged home via private auto.  Tranika Scholler S Lorren Rossetti, RN

## 2022-07-30 IMAGING — CR DG THORACIC SPINE 2V
1 series · 1 of 1 positions shown · non-contrast
Comparison: Chest CT 07/17/2020

CLINICAL DATA: Pain related to fall

EXAM:
THORACIC SPINE 2 VIEWS

[t thoracic breathing lat]
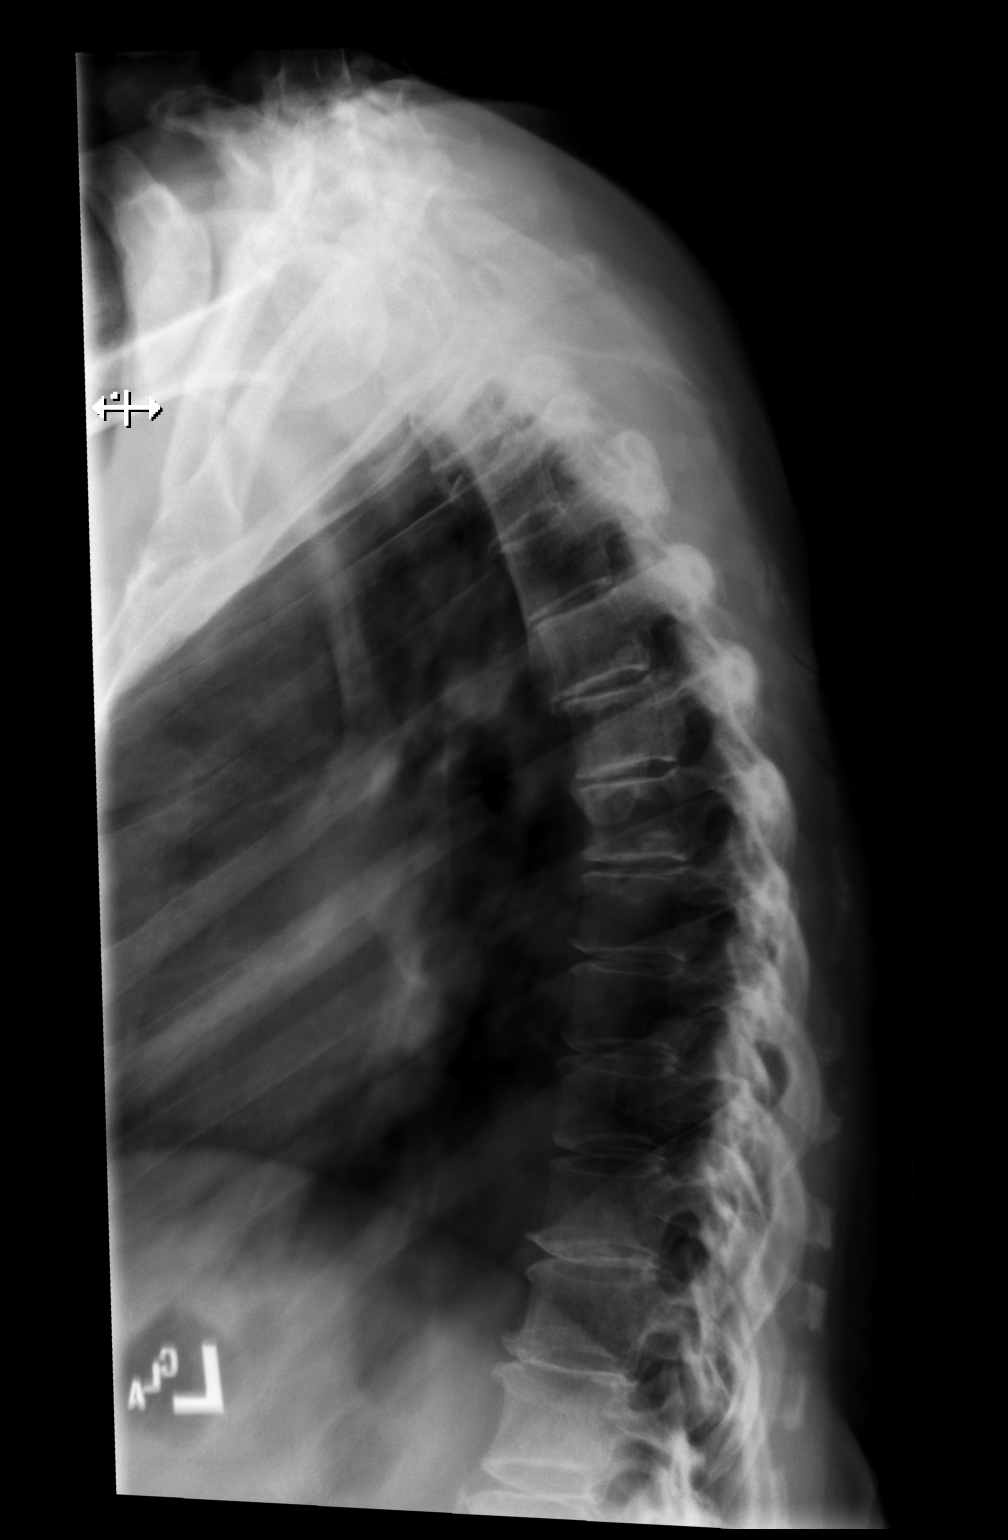

[1 of 1 positions shown; findings below may reference images not displayed]

FINDINGS: No acute fracture or traumatic malalignment. Generalized thoracic
disc space narrowing and endplate spurring. Maintained posterior
mediastinal fat planes.
IMPRESSION: No acute finding.

## 2022-07-30 IMAGING — CR DG LUMBAR SPINE COMPLETE 4+V
6 series · 6 of 6 positions shown · non-contrast
Comparison: None.

CLINICAL DATA: Pain after fall.

EXAM:
LUMBAR SPINE - COMPLETE 4+ VIEW

[t lumbar spine ap]
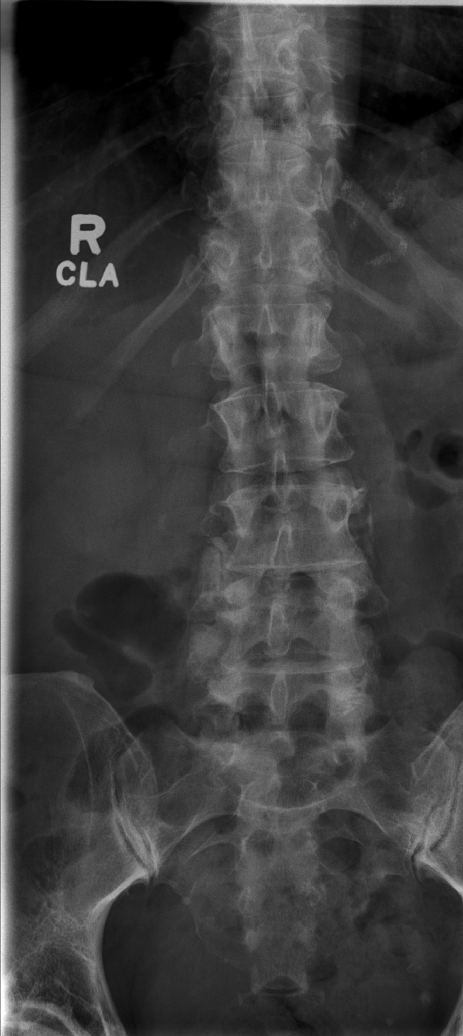

[t lumbar spine obl (1 of 3)]
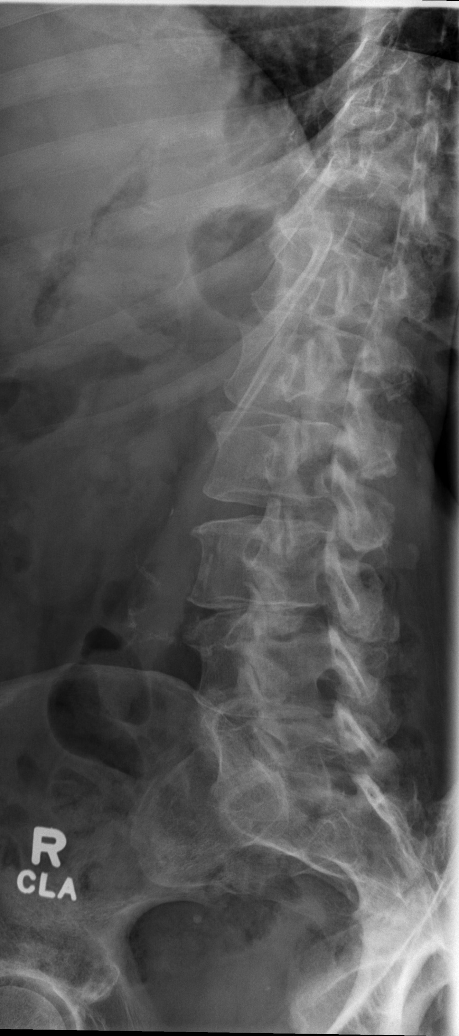

[t lumbar spine obl (2 of 3)]
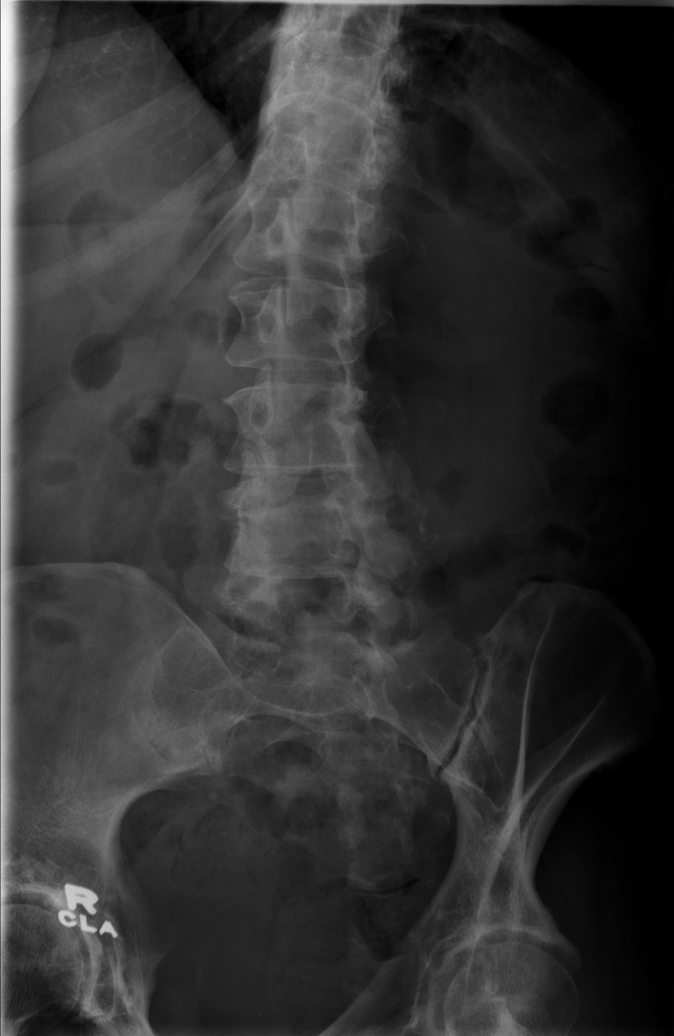

[t lumbar spine obl (3 of 3)]
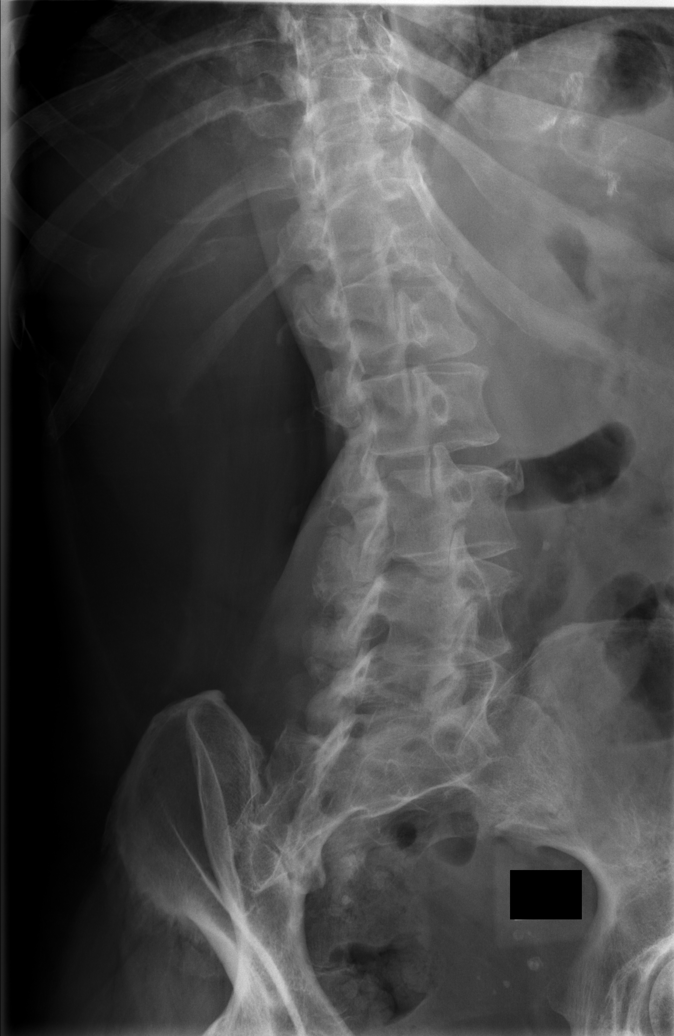

[t lumbar spine lat]
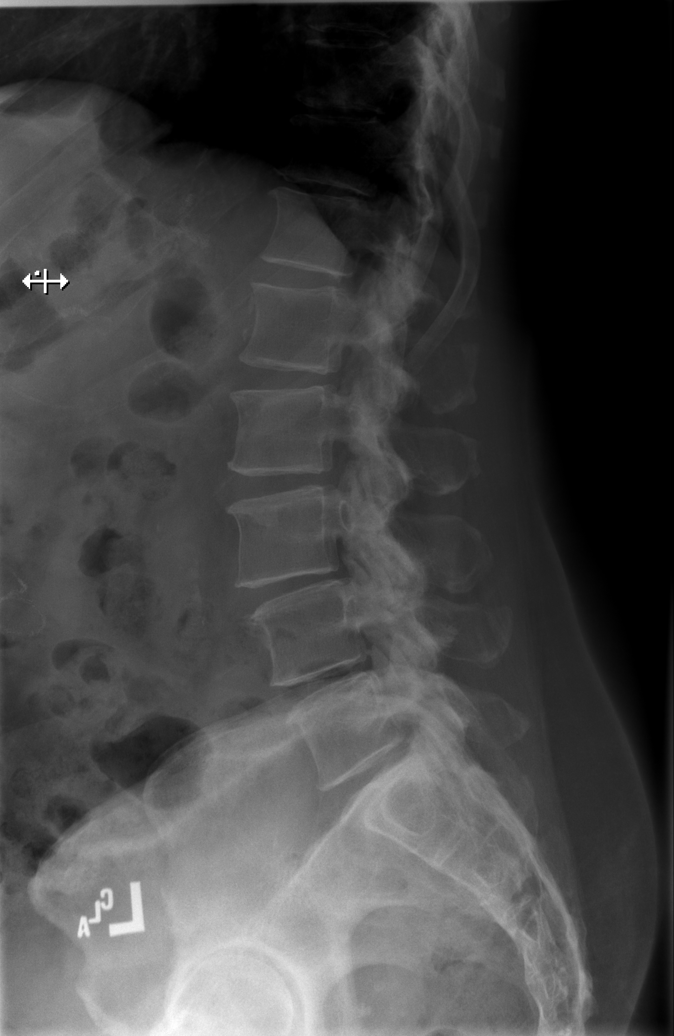

[t lumbar l-5 s-1 spot]
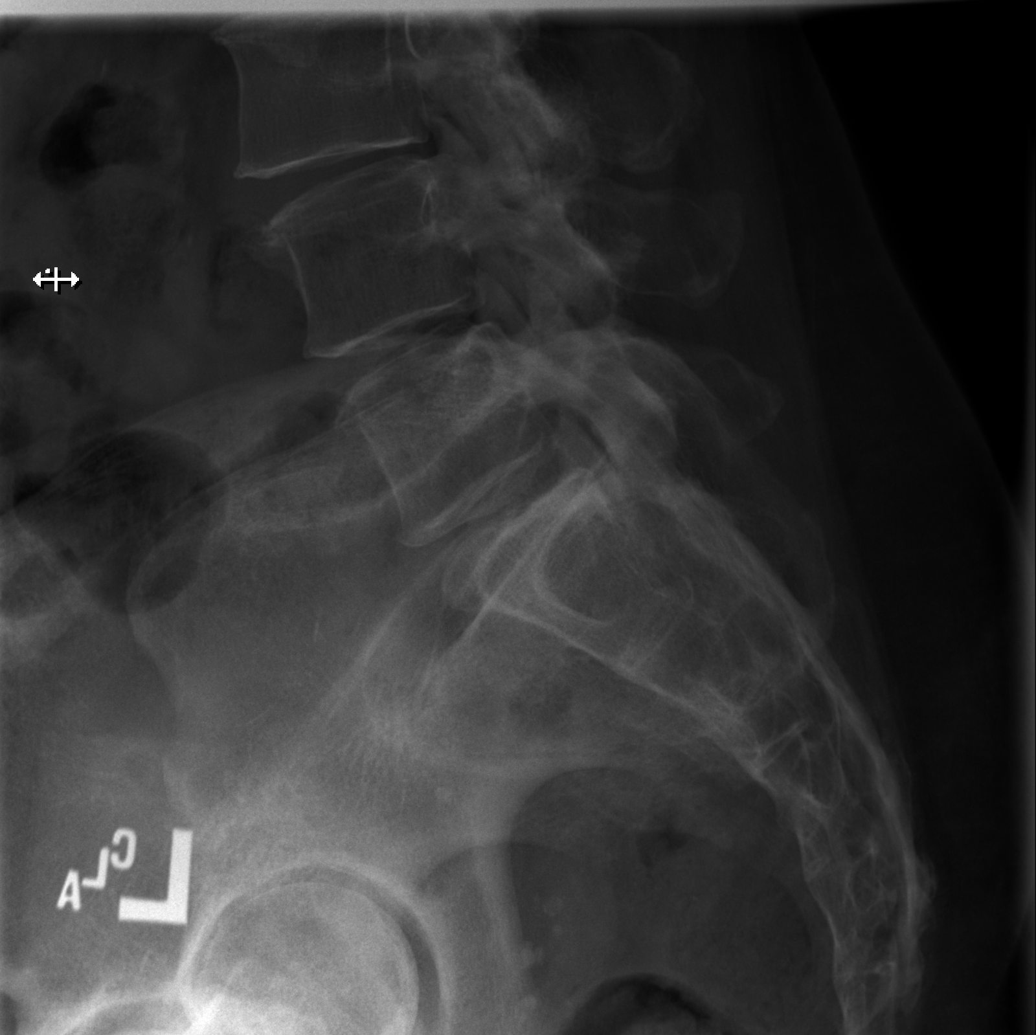

[6 of 6 positions shown; findings below may reference images not displayed]

FINDINGS: No evidence of acute fracture or traumatic malalignment. Multilevel
degenerative facet spurring mild L3-4 anterolisthesis. Disc space
narrowing mainly at L3-4 and below.
IMPRESSION: 1. No acute finding.
2. Degeneration primarily at L3-4 and below.
